# Patient Record
Sex: Male | Born: 1935 | Race: Black or African American | Hispanic: No | Marital: Married | Smoking: Never smoker
Health system: Southern US, Community
[De-identification: ages and names within clinical notes are randomized; demographics above are authoritative.]

## PROBLEM LIST (undated history)

## (undated) ENCOUNTER — Emergency Department (HOSPITAL_COMMUNITY): Admission: EM | Payer: Medicare Other | Source: Home / Self Care

## (undated) DIAGNOSIS — C801 Malignant (primary) neoplasm, unspecified: Secondary | ICD-10-CM

## (undated) DIAGNOSIS — E119 Type 2 diabetes mellitus without complications: Secondary | ICD-10-CM

## (undated) DIAGNOSIS — M109 Gout, unspecified: Secondary | ICD-10-CM

## (undated) DIAGNOSIS — N189 Chronic kidney disease, unspecified: Secondary | ICD-10-CM

## (undated) DIAGNOSIS — R413 Other amnesia: Secondary | ICD-10-CM

## (undated) DIAGNOSIS — I10 Essential (primary) hypertension: Secondary | ICD-10-CM

## (undated) HISTORY — PX: TONSILLECTOMY: SUR1361

## (undated) HISTORY — PX: KNEE SURGERY: SHX244

## (undated) HISTORY — PX: COLONOSCOPY W/ BIOPSIES AND POLYPECTOMY: SHX1376

## (undated) HISTORY — PX: BACK SURGERY: SHX140

---

## 2008-01-28 ENCOUNTER — Inpatient Hospital Stay (HOSPITAL_COMMUNITY): Admission: EM | Admit: 2008-01-28 | Discharge: 2008-02-02 | Payer: Self-pay | Admitting: Emergency Medicine

## 2008-03-06 ENCOUNTER — Encounter: Admission: RE | Admit: 2008-03-06 | Discharge: 2008-03-06 | Payer: Self-pay | Admitting: Neurosurgery

## 2008-10-26 IMAGING — CR DG THORACOLUMBAR SPINE 2V
2 series · 2 of 2 positions shown · non-contrast
Comparison: Preoperative portal chest x-ray 01/28/2008.

CLINICAL DATA: Status post PLIF.

THORACOLUMBAR SPINE - 2 VIEW

[t t-spine/l-spine junc. ap]
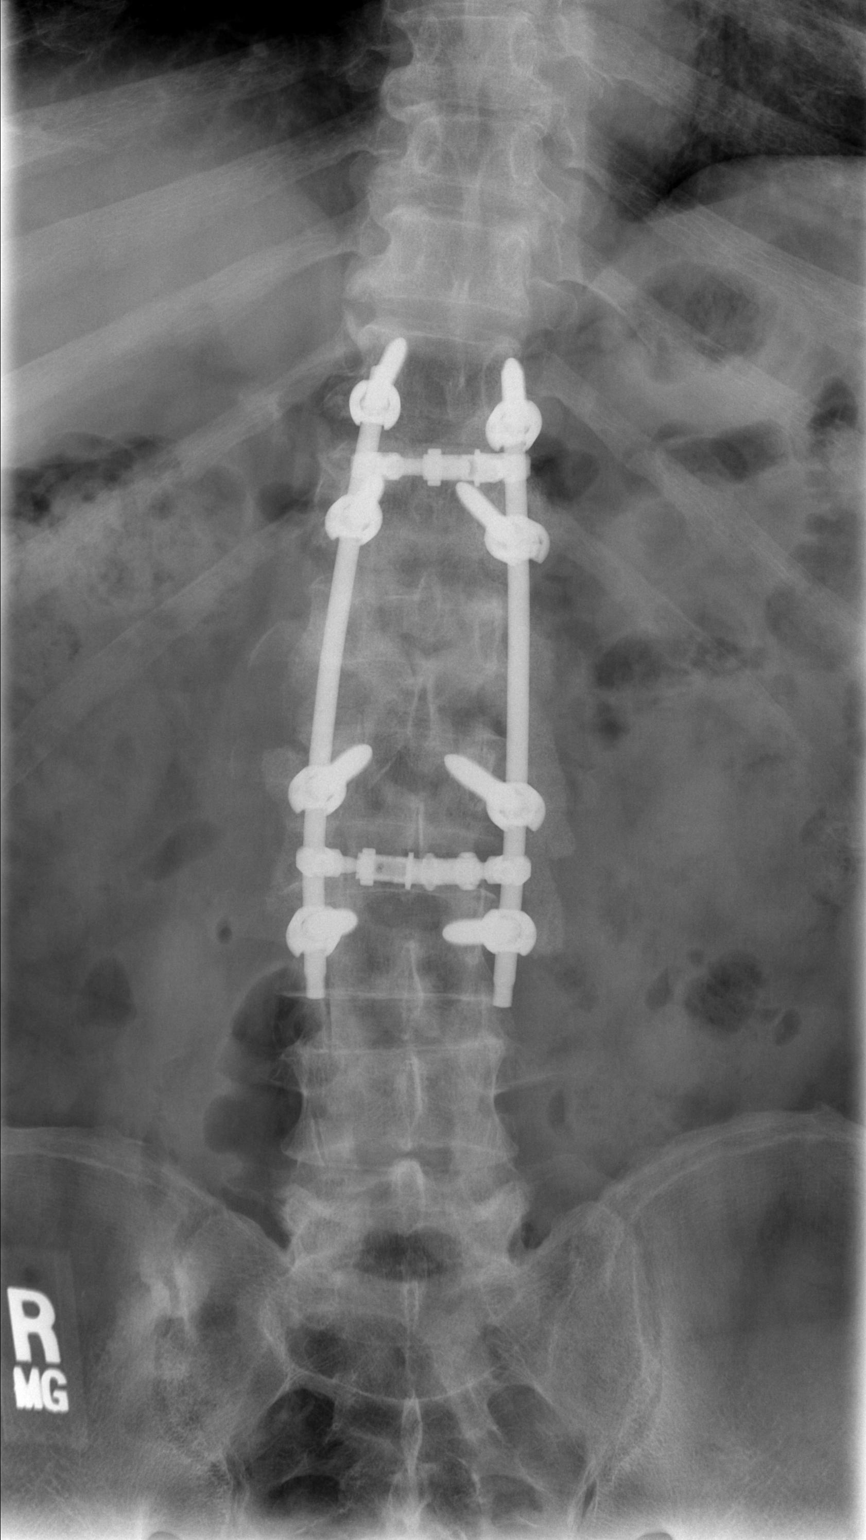

[t t-spine/l-spine junc lat]
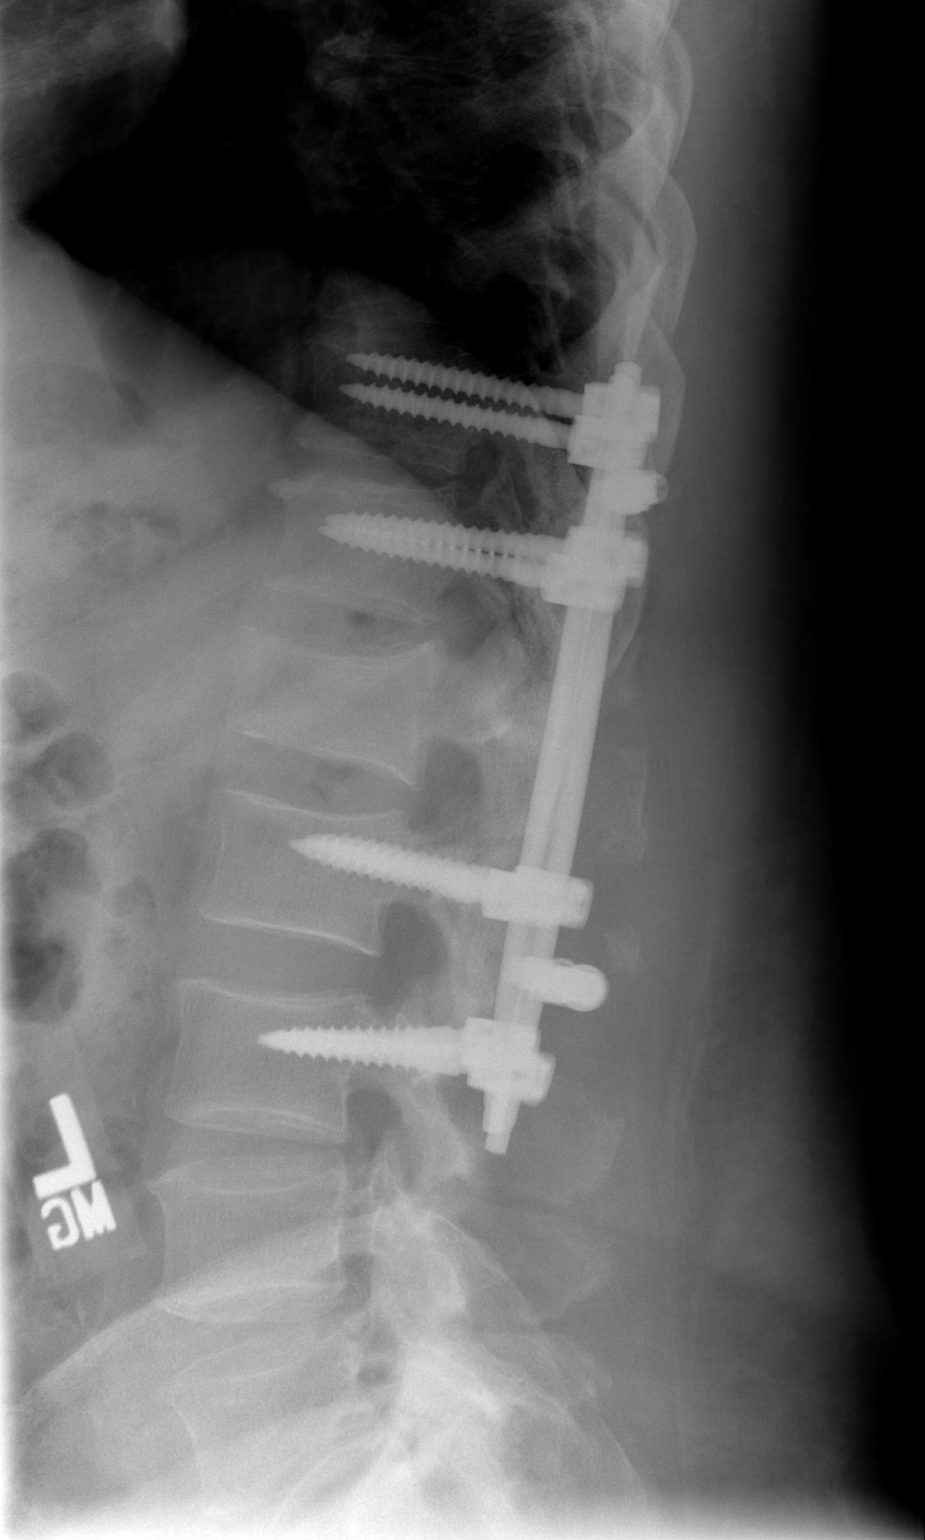

[2 of 2 positions shown; findings below may reference images not displayed]

FINDINGS: The patient is status post pedicle screw and rod fixation
from T11-L3 across the superior endplate compression fracture of
L1.  The L1 vertebral body is mildly sclerotic.  This is likely due
to osteoporotic compression fracture.  Alignment is anatomic.
There is no evidence for hardware complication.
IMPRESSION: Status post posterior fusion T11-L3 across an L1 superior endplate
compression fracture.

## 2010-05-27 ENCOUNTER — Emergency Department (HOSPITAL_COMMUNITY): Admission: EM | Admit: 2010-05-27 | Discharge: 2010-05-27 | Payer: Self-pay | Admitting: Emergency Medicine

## 2011-01-13 NOTE — Op Note (Signed)
Timothy Clay, Timothy Clay             ACCOUNT NO.:  [id number]   MEDICAL RECORD NO.:  [id number]          PATIENT TYPE:  INP   LOCATION:  3012                         FACILITY:  [facility]   PHYSICIAN:  Kathaleen Maser. Pool, M.D.    DATE OF BIRTH:  1935/10/13   DATE OF PROCEDURE:  01/29/2008  DATE OF DISCHARGE:                               OPERATIVE REPORT   DIAGNOSIS:  L1 burst fracture without spinal cord injury.   POSTOPERATIVE DIAGNOSIS:  L1 burst fracture without spinal cord injury.   PROCEDURES:  1. T11 through L3 posterolateral arthrodesis utilizing segmental      pedicle fixation and iliac crest autografting and bone graft      substitute.  2. Right iliac crest bone harvest.   SURGEON:  Kathaleen Maser. Pool, MD   ANESTHESIA:  General endotracheal.   INDICATION:  [name] is a 75 year old male who fell from a ladder  with a resultant L1 burst fracture.  The patient has no evidence of  neurological injury.  Workup demonstrates evidence of three-column  injury of his L1 vertebra without evidence of significant spinal  stenosis.  The patient has been counseled as to his options.  He has  decided to proceed with posterolateral arthrodesis in hopes of  stabilizing in spine and improving his situation.  The risks and  benefits have been discussed.  The patient including not limited the  risks of anesthesia, bleeding, infection, CSF leak, nerve root injury,  spinal cord injury, fusion failure, station failure, kidney pain, and  nonbenefit.  The patient was allowed to ask questions.  Understanding,  he wishes to proceed with surgery.  We will move forward today.   OPERATIVE:  The patient was placed on operative table in supine  position.  Anesthesia was achieved.  The patient was positioned prone  onto Wilson frame and appropriately padded.  The patient's thoracic and  lumbar regions were prepped draped sterilely.  A 10 blade was used to  make a linear incision extending from approximately  T10 down to L4.  This carried down sharply in the midline.  The subperiosteal dissection  then performed exposing the lamina, facet joints, and transverse process  of T11, T12, L1, L2, and L3.  Deep self-retaining retractors were  placed.  Intraoperative x-rays taken.  Level was confirmed.  All other  dissection was taking place.  A separate fascial plane was developed  over the lumbodorsal fascia on the right side down to level of the right  iliac crest.  The right iliac crest was dissected free.  A tricortical  piece of ileum was resected.  The iliac wing was then curetted with  curved bone gouges.  Autograft was collected for later use and fusion.  After an adequate volume of bone was achieved, the iliac crest was waxed  and the fascia was reapproximated with 0 Vicryl sutures.  He was then  placed back in the midline.  Pedicle substation was then placed at T11,  T12, L2, and L3 bilaterally under fluoroscopic guidance.  Entry sites  were made using the high-speed drill.  Using the surface landmarks and  intraoperative fluoroscopy, superficial bone around the pedicle was  first removed.  Using the high-speed drill, each pedicle was then probed  using the pedicle awl.  Pedicle awl track was probed and found to be  solid bone bridge.  The pedicle awl track was then tapped with a screw  tap.  Each screw tap hole was probed and found to be solid bone.  At T11  and T12, 5.75 x 45-mm radius screws were placed bilaterally.  At L2 and  L3, 6.75 x 14-mm screws were placed bilaterally.  Transverse processes  of L1, L2, and L3, as well as the transverse processes, facet joints,  and lamina of T11 and T12 were then decorticated using the high-speed  drill.  Morselized autograft mixed with Master graft bone graft  substitute was packed posterolaterally for later fusion.  A segment of  Titanium rod was then contoured and placed over the screw heads from T11-  L3.  Locking caps were then placed over the  screw heads.  The locking  caps were then engaged with construct under gentle compression.  Transverse connectors were placed in two locations.  Final images were  taken revealing good position of the bone graft hardware proper level  with a normalized spine.  We then irrigated one final time, then closed  in layers using Vicryl sutures.  Steri-Strips were applied.  No  complications.  The patient was well and he returned to recovery room  postoperatively.           ______________________________  Kathaleen Maser Pool, M.D.     HAP/MEDQ  D:  01/29/2008  T:  01/30/2008  Job:  [id number]

## 2011-01-16 NOTE — Discharge Summary (Signed)
NAMECHAISE, MAHABIR             ACCOUNT NO.:  [id number]   MEDICAL RECORD NO.:  [id number]          PATIENT TYPE:  INP   LOCATION:  3012                         FACILITY:  [facility]   PHYSICIAN:  Kathaleen Maser. Pool, M.D.    DATE OF BIRTH:  May 10, 1936   DATE OF ADMISSION:  01/28/2008  DATE OF DISCHARGE:  02/02/2008                               DISCHARGE SUMMARY   FINAL DIAGNOSIS:  L1 burst fracture secondary to fall.   OPERATIONS AND TREATMENTS:  T11 through L3 posterolateral arthrodesis  utilizing segmental pedicle-screw instrumentation and autografting.   HISTORY OF PRESENT ILLNESS:  Mr. Timothy Clay is a 75 year old male who  fell from a ladder, with evidence of an L1 burst fracture without spinal-  cord or cauda-equina injury.  The patient is taken to the operating room  for operative stabilization.   HOSPITAL COURSE:  The patient went to the operating room where an  uncomplicated T11-L3 fusion was performed.  Postoperatively, the patient  has done well.  Back pain is much improved postoperatively.  Wound is  healing well.  The patient was fitted for a thoracolumbar orthosis.  He  was gradually mobilized with the assistance of physical and occupational  therapy.  He is ready for home discharge.  He is neurologically intact.   CONDITION ON DISCHARGE:  Improved.   DISCHARGE DISPOSITION:  The patient will follow up in my office in 1  week.           ______________________________  Kathaleen Maser. Pool, M.D.     HAP/MEDQ  D:  03/27/2008  T:  03/27/2008  Job:  [id number]

## 2011-05-27 LAB — DIFFERENTIAL
Basophils Absolute: 0
Basophils Relative: 0
Eosinophils Relative: 0
Lymphocytes Relative: 10 — ABNORMAL LOW
Monocytes Absolute: 0.5
Monocytes Relative: 6

## 2011-05-27 LAB — COMPREHENSIVE METABOLIC PANEL
AST: 31
Albumin: 3.8
Alkaline Phosphatase: 69
Chloride: 110
GFR calc Af Amer: >60
Potassium: 4.3
Total Bilirubin: 1.7 — ABNORMAL HIGH
Total Protein: 6.6

## 2011-05-27 LAB — CROSSMATCH

## 2011-05-27 LAB — CBC
Platelets: 147 — ABNORMAL LOW
RDW: 13.9
WBC: 8.8

## 2011-05-27 LAB — ABO/RH: ABO/RH(D): O POS

## 2011-05-28 LAB — CBC
HCT: 29 — ABNORMAL LOW
Hemoglobin: 9.7 — ABNORMAL LOW
MCV: 83.7
Platelets: 91 — ABNORMAL LOW
WBC: 6.4

## 2020-12-26 ENCOUNTER — Ambulatory Visit: Payer: Self-pay

## 2020-12-26 ENCOUNTER — Ambulatory Visit (INDEPENDENT_AMBULATORY_CARE_PROVIDER_SITE_OTHER): Payer: Medicare Other | Admitting: Surgery

## 2020-12-26 ENCOUNTER — Ambulatory Visit: Payer: Self-pay | Admitting: Surgery

## 2020-12-26 ENCOUNTER — Encounter: Payer: Self-pay | Admitting: Surgery

## 2020-12-26 ENCOUNTER — Other Ambulatory Visit: Payer: Self-pay

## 2020-12-26 VITALS — BP 162/83 | HR 49

## 2020-12-26 DIAGNOSIS — M79641 Pain in right hand: Secondary | ICD-10-CM | POA: Diagnosis not present

## 2020-12-26 DIAGNOSIS — M542 Cervicalgia: Secondary | ICD-10-CM

## 2020-12-26 DIAGNOSIS — G5691 Unspecified mononeuropathy of right upper limb: Secondary | ICD-10-CM

## 2020-12-26 DIAGNOSIS — M79642 Pain in left hand: Secondary | ICD-10-CM

## 2020-12-26 DIAGNOSIS — G5692 Unspecified mononeuropathy of left upper limb: Secondary | ICD-10-CM | POA: Diagnosis not present

## 2020-12-26 NOTE — Progress Notes (Signed)
Office Visit Note   Patient: Timothy Clay           Date of Birth: 07-24-1936           MRN: [id number] Visit Date: 12/26/2020              Requested by: No referring provider defined for this encounter. PCP: Andres Shad, MD   Assessment & Plan: Visit Diagnoses:  1. Bilateral hand pain   2. Neck pain   3. Neuropathy of hand, left   4. Neuropathy of right hand     Plan: With patient's ongoing and worsening symptoms recommend getting a NCV/EMG study bilateral upper extremities to rule out cervical radiculopathy and bilateral carpal tunnel syndrome.  Patient will have study done next week with Dr. Ernestina Patches.  I will have patient follow-up with me in 3 weeks for recheck to discuss his results.  I will decide at that time if he needs cervical MRI depending on findings on nerve conduction study.  Advised patient and family members that some of his symptoms may be coming from issues at C5-6 and may also possibly have double crush phenomenon.  It if it looks like he is needing treatment with carpal tunnel releases I will discuss this with Dr. Louanne Skye.  All questions answered.  He can continue wearing his wrist splints at night.  Follow-Up Instructions: Return in about 3 weeks (around 01/16/2021) for with [facility] to review ncv/emg.   Orders:  Orders Placed This Encounter  Procedures  . XR Hand Complete Left  . XR Hand Complete Right  . XR Cervical Spine 2 or 3 views  . Ambulatory referral to Physical Medicine Rehab   No orders of the defined types were placed in this encounter.     Procedures: No procedures performed   Clinical Data: No additional findings.   Subjective: Chief Complaint  Patient presents with  . Right Hand - Pain  . Left Hand - Pain    HPI 85 year old black male who is new patient to clinic comes in with complaints of worsening bilateral hand pain numbness and tingling and some weakness.  Patient has a grandfather of April who works in our office.  He  states that his hand symptoms have been ongoing and worsening for about a year and a half.  He states that he saw his primary care physician about a year ago and was told that he probably had carpal tunnel syndrome.  He has not had any studies.  He denies any neck pain.  States that hands bother him with all activity.  He has been trying conservative management with wrist plants without significant improvement. Review of Systems No current cardiac pulmonary GI GU issues  Objective: Vital Signs: BP (!) 162/83   Pulse (!) 49   Physical Exam Constitutional:      Comments: Very pleasant elderly black male alert and oriented in no acute distress.  Patient comes in with family members today.  Eyes:     Extraocular Movements: Extraocular movements intact.  Pulmonary:     Effort: No respiratory distress.  Musculoskeletal:     Cervical back: Tenderness (Bilateral brachial plexus tenderness.) present.     Comments: Bilateral shoulder and elbow exam unremarkable.  Negative Tinel's over the cubital tunnels.  Bilateral wrist positive Phalen's and Tinel's.  He has bilateral thenar atrophy.  Also has moderate to marked tenderness at the bilateral first CMC joints.  Neurological:     Mental Status: He is alert and  oriented to person, place, and time.  Psychiatric:        Mood and Affect: Mood normal.     Ortho Exam  Specialty Comments:  No specialty comments available.  Imaging: No results found.   PMFS History: There are no problems to display for this patient.  History reviewed. No pertinent past medical history.  History reviewed. No pertinent family history.  History reviewed. No pertinent surgical history. Social History   Occupational History  . Not on file  Tobacco Use  . Smoking status: Never Smoker  . Smokeless tobacco: Not on file  Substance and Sexual Activity  . Alcohol use: Not on file  . Drug use: Not on file  . Sexual activity: Not on file

## 2021-01-02 ENCOUNTER — Other Ambulatory Visit: Payer: Self-pay

## 2021-01-02 ENCOUNTER — Ambulatory Visit (INDEPENDENT_AMBULATORY_CARE_PROVIDER_SITE_OTHER): Payer: Medicare Other | Admitting: Physical Medicine and Rehabilitation

## 2021-01-02 ENCOUNTER — Encounter: Payer: Self-pay | Admitting: Physical Medicine and Rehabilitation

## 2021-01-02 DIAGNOSIS — R202 Paresthesia of skin: Secondary | ICD-10-CM

## 2021-01-02 NOTE — Progress Notes (Signed)
Pt state he has pain and numbness in both hands and fingers. Pt state he has numbness in both thumb, pointer and middlefinger Pt state The numbness and pain keeps him up at night. 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 Pt state he use pain cream to help ease his pain. Pt state hes right handed.  Numeric Pain Rating Scale and Functional Assessment Average Pain 10   In the last MONTH (on 0-10 scale) has pain interfered with the following?  1. General activity like being  able to carry out your everyday physical activities such as walking, climbing stairs, carrying groceries, or moving a chair?  Rating(10)

## 2021-01-03 DIAGNOSIS — M109 Gout, unspecified: Secondary | ICD-10-CM | POA: Insufficient documentation

## 2021-01-03 DIAGNOSIS — I10 Essential (primary) hypertension: Secondary | ICD-10-CM | POA: Insufficient documentation

## 2021-01-03 DIAGNOSIS — E119 Type 2 diabetes mellitus without complications: Secondary | ICD-10-CM | POA: Insufficient documentation

## 2021-01-03 NOTE — Progress Notes (Signed)
Timothy Clay - 85 y.o. male MRN [id number]  Date of birth: 08-Feb-1936  Office Visit Note: Visit Date: 01/02/2021 PCP: Andres Shad, MD Referred by: Andres Shad, *  Subjective: Chief Complaint  Patient presents with  . Left Hand - Pain, Numbness  . Right Hand - Pain, Numbness   HPI:  Timothy Clay is a 85 y.o. male who comes in today at the request of Timothy Core, PA-C for electrodiagnostic study of the Bilateral upper extremities.  Patient is Right hand dominant.  He reports chronic long-term 10 out of 10 pain numbness and tingling in both hands left more than right.  He reports symptoms mainly in the radial 3 digits of the thumb index and middle finger bilaterally.  He does get nocturnal complaints.  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  He does use some pain creams and medications but without much relief.  He has noted difficulty with manipulating small objects and weakness in the hands.  He does not report any frank radicular symptoms.  He has had no prior electrodiagnostic studies.  ROS Otherwise per HPI.  Assessment & Plan: Visit Diagnoses:    ICD-10-CM   1. Paresthesia of skin  R20.2 NCV with EMG (electromyography)    Plan: Impression: The above electrodiagnostic study is ABNORMAL and reveals evidence of a severe BLATERAL left worse than right median nerve entrapment at the wrist (carpal tunnel syndrome) affecting sensory and motor components. The lesion is characterized by sensory and motor demyelination with evidence of significant axonal injury.  Despite appropriate decompression treatment there is likely going to be residual symptoms.   There is no significant electrodiagnostic evidence of any other focal nerve entrapment, brachial plexopathy or cervical radiculopathy.   Recommendations: 1.  Follow-up with referring physician. 2.  Continue current management of symptoms. 3.  Suggest surgical evaluation.   Meds & Orders: No orders of the defined  types were placed in this encounter.   Orders Placed This Encounter  Procedures  . NCV with EMG (electromyography)    Follow-up: Return for Timothy Core, PA-C as scheduled.   Procedures: No procedures performed  EMG & NCV Findings: Evaluation of the left median motor nerve showed no response (Wrist) and no response (Elbow).  The right median motor nerve showed prolonged distal onset latency (12.4 ms), reduced amplitude (1.5 mV), and decreased conduction velocity (Elbow-Wrist, 40 m/s).  The left median (across palm) sensory and the right median (across palm) sensory nerves showed no response (Wrist) and no response (Palm).  The right ulnar sensory nerve showed reduced amplitude (9.7 V).  All remaining nerves (as indicated in the following tables) were within normal limits.    Needle evaluation of the left abductor pollicis brevis muscle showed decreased insertional activity, widespread spontaneous activity, and diminished recruitment.  All remaining muscles (as indicated in the following table) showed no evidence of electrical instability.    Impression: The above electrodiagnostic study is ABNORMAL and reveals evidence of a severe BLATERAL left worse than right median nerve entrapment at the wrist (carpal tunnel syndrome) affecting sensory and motor components. The lesion is characterized by sensory and motor demyelination with evidence of significant axonal injury.  Despite appropriate decompression treatment there is likely going to be residual symptoms.   There is no significant electrodiagnostic evidence of any other focal nerve entrapment, brachial plexopathy or cervical radiculopathy.   Recommendations: 1.  Follow-up with referring physician. 2.  Continue current management of symptoms. 3.  Suggest surgical evaluation.  ___________________________  Laurence Spates FAAPMR Board Certified, American Board of Physical Medicine and Rehabilitation    Nerve Conduction Studies Anti Sensory  Summary Table   Stim Site NR Peak (ms) Norm Peak (ms) P-T Amp (V) Norm P-T Amp Site1 Site2 Delta-P (ms) Dist (cm) Vel (m/s) Norm Vel (m/s)  Left Median Acr Palm Anti Sensory (2nd Digit)  30.1C  Wrist *NR  <3.6  >10 Wrist Palm  0.0    Palm *NR  <2.0          Right Median Acr Palm Anti Sensory (2nd Digit)  30.2C  Wrist *NR  <3.6  >10 Wrist Palm  0.0    Palm *NR  <2.0          Right Radial Anti Sensory (Base 1st Digit)  30.8C  Wrist    2.5 <3.1 17.2  Wrist Base 1st Digit 2.5 0.0    Right Ulnar Anti Sensory (5th Digit)  31C  Wrist    3.6 <3.7 *9.7 >15.0 Wrist 5th Digit 3.6 14.0 39 >38   Motor Summary Table   Stim Site NR Onset (ms) Norm Onset (ms) O-P Amp (mV) Norm O-P Amp Site1 Site2 Delta-0 (ms) Dist (cm) Vel (m/s) Norm Vel (m/s)  Left Median Motor (Abd Poll Brev)  30.7C  Wrist *NR  <4.2  >5 Elbow Wrist  27.0  >50  Elbow *NR            Right Median Motor (Abd Poll Brev)  31C  Wrist    *12.4 <4.2 *1.5 >5 Elbow Wrist 6.7 26.5 *40 >50  Elbow    19.1  1.4         Right Ulnar Motor (Abd Dig Min)  31C  Wrist    3.3 <4.2 6.8 >3 B Elbow Wrist 4.7 25.0 53 >53  B Elbow    8.0  4.6  A Elbow B Elbow 2.0 11.0 55 >53  A Elbow    10.0  6.6          EMG   Side Muscle Nerve Root Ins Act Fibs Psw Amp Dur Poly Recrt Int Fraser Din Comment  Left Abd Poll Brev Median C8-T1 *Decr *4+ *4+ Nml Nml 0 *Reduced Nml rare MUAP  Left 1stDorInt Ulnar C8-T1 Nml Nml Nml Nml Nml 0 Nml Nml   Left PronatorTeres Median C6-7 Nml Nml Nml Nml Nml 0 Nml Nml     Nerve Conduction Studies Anti Sensory Left/Right Comparison   Stim Site L Lat (ms) R Lat (ms) L-R Lat (ms) L Amp (V) R Amp (V) L-R Amp (%) Site1 Site2 L Vel (m/s) R Vel (m/s) L-R Vel (m/s)  Median Acr Palm Anti Sensory (2nd Digit)  30.1C  Wrist       Wrist Palm     Palm             Radial Anti Sensory (Base 1st Digit)  30.8C  Wrist  2.5   17.2  Wrist Base 1st Digit     Ulnar Anti Sensory (5th Digit)  31C  Wrist  3.6   *9.7  Wrist 5th Digit  39     Motor Left/Right Comparison   Stim Site L Lat (ms) R Lat (ms) L-R Lat (ms) L Amp (mV) R Amp (mV) L-R Amp (%) Site1 Site2 L Vel (m/s) R Vel (m/s) L-R Vel (m/s)  Median Motor (Abd Poll Brev)  30.7C  Wrist  *12.4   *1.5  Elbow Wrist  40   Elbow  19.1   1.4  Ulnar Motor (Abd Dig Min)  31C  Wrist  3.3   6.8  B Elbow Wrist  53   B Elbow  8.0   4.6  A Elbow B Elbow  55   A Elbow  10.0   6.6           Waveforms:                 Clinical History: No specialty comments available.     Objective:  VS:  HT:    WT:   BMI:     BP:   HR: bpm  TEMP: ( )  RESP:  Physical Exam Musculoskeletal:        General: No tenderness.     Comments: Inspection reveals significant atrophy of the left APB but also atrophy of the right APB but no atrophy of the bilateral  FDI or hand intrinsics. There is no swelling, color changes, allodynia or dystrophic changes. There is 5 out of 5 strength in the bilateral wrist extension, finger abduction and long finger flexion.  There is decreased sensation to light touch in the bilateral median nerve distribution. There is a negative Hoffmann's test bilaterally.  Skin:    General: Skin is warm and dry.     Findings: No erythema or rash.  Neurological:     General: No focal deficit present.     Mental Status: He is alert and oriented to person, place, and time.     Sensory: No sensory deficit.     Motor: No weakness or abnormal muscle tone.     Coordination: Coordination normal.     Gait: Gait normal.  Psychiatric:        Mood and Affect: Mood normal.        Behavior: Behavior normal.        Thought Content: Thought content normal.      Imaging: No results found.

## 2021-01-06 ENCOUNTER — Other Ambulatory Visit: Payer: Self-pay

## 2021-01-06 ENCOUNTER — Ambulatory Visit (INDEPENDENT_AMBULATORY_CARE_PROVIDER_SITE_OTHER): Payer: Medicare Other | Admitting: Specialist

## 2021-01-06 VITALS — BP 175/84 | HR 51 | Ht 74.0 in | Wt 208.0 lb

## 2021-01-06 DIAGNOSIS — G5603 Carpal tunnel syndrome, bilateral upper limbs: Secondary | ICD-10-CM

## 2021-01-06 MED ORDER — GABAPENTIN 300 MG PO CAPS: 300.0000 mg | ORAL_CAPSULE | Freq: Every day | ORAL | 1 refills | Status: DC

## 2021-01-06 MED ORDER — METHYLPREDNISOLONE 4 MG PO TABS: 4.0000 mg | ORAL_TABLET | Freq: Every day | ORAL | 0 refills | Status: AC

## 2021-01-06 NOTE — Progress Notes (Signed)
Office Visit Note   Patient: Timothy Clay           Date of Birth: 05/18/1936           MRN: [id number] Visit Date: 01/06/2021              Requested by: Andres Shad, MD [address] [place] [id number] PCP: Andres Shad, MD   Assessment & Plan: Visit Diagnoses:  1. Carpal tunnel syndrome, bilateral     Plan:May use the wrist splints as needed. Our office will contact you to schedule for a left open carpal tunnel release. Kandice Hams is the surgery scheduler and she will get approval from your insurance and call you. Risk of surgery includes risk infection 1:300, bleeding is not usual, 1 in 1,000,000 risk of blood loss that is significant. Risk to the nerve is 1:30,000.   Follow-Up Instructions: No follow-ups on file.   Orders:  No orders of the defined types were placed in this encounter.  No orders of the defined types were placed in this encounter.     Procedures: No procedures performed   Clinical Data: No additional findings.   Subjective: Chief Complaint  Patient presents with  . Right Hand - Pain  . Left Hand - Pain    85 years old right handed male with 1 year history of bilateral hand and wrist pain. The pain is severe with night pain and difficulty sleeping. He is dropping items and has numbness and tingling. No neck pain or weakness. No neck pain with ROM. Underwent EMG/NCV due to severe pain and this shows severe CTS bilateral hands. .    Review of Systems  Constitutional: Negative.   HENT: Negative.   Eyes: Negative.   Respiratory: Negative.   Cardiovascular: Negative.   Gastrointestinal: Negative.   Endocrine: Negative.   Genitourinary: Negative.   Musculoskeletal: Negative.   Skin: Negative.   Allergic/Immunologic: Negative.   Neurological: Negative.   Hematological: Negative.   Psychiatric/Behavioral: Negative.      Objective: Vital Signs: BP (!) 175/84 (BP Location: Left Arm, Patient Position:  Sitting)   Pulse (!) 51   Ht 6\' 2"  (1.88 m)   Wt 208 lb (94.3 kg)   BMI 26.71 kg/m   Physical Exam Constitutional:      Appearance: He is well-developed.  HENT:     Head: Normocephalic and atraumatic.  Eyes:     Pupils: Pupils are equal, round, and reactive to light.  Pulmonary:     Effort: Pulmonary effort is normal.     Breath sounds: Normal breath sounds.  Abdominal:     General: Bowel sounds are normal.     Palpations: Abdomen is soft.  Musculoskeletal:        General: Normal range of motion.     Cervical back: Normal range of motion and neck supple.  Skin:    General: Skin is warm and dry.  Neurological:     Mental Status: He is alert and oriented to person, place, and time.  Psychiatric:        Behavior: Behavior normal.        Thought Content: Thought content normal.        Judgment: Judgment normal.     Right Hand Exam   Tests  Phalen's sign: positive Tinel's sign (median nerve): positive  Comments:  Reverse phalens positive immediately   Left Hand Exam   Tests  Phalen's sign: positive Tinel's sign (median nerve): positive  Comments:  Reverse phalens positive immediately      Specialty Comments:  No specialty comments available.  Imaging: No results found.   PMFS History: Patient Active Problem List   Diagnosis Date Noted  . Gout 01/03/2021  . Type 2 diabetes mellitus ([place]) 01/03/2021  . Hypertension 01/03/2021   No past medical history on file.  No family history on file.  No past surgical history on file. Social History   Occupational History  . Not on file  Tobacco Use  . Smoking status: Never Smoker  . Smokeless tobacco: Not on file  Substance and Sexual Activity  . Alcohol use: Not on file  . Drug use: Not on file  . Sexual activity: Not on file

## 2021-01-06 NOTE — Procedures (Signed)
EMG & NCV Findings: Evaluation of the left median motor nerve showed no response (Wrist) and no response (Elbow).  The right median motor nerve showed prolonged distal onset latency (12.4 ms), reduced amplitude (1.5 mV), and decreased conduction velocity (Elbow-Wrist, 40 m/s).  The left median (across palm) sensory and the right median (across palm) sensory nerves showed no response (Wrist) and no response (Palm).  The right ulnar sensory nerve showed reduced amplitude (9.7 V).  All remaining nerves (as indicated in the following tables) were within normal limits.    Needle evaluation of the left abductor pollicis brevis muscle showed decreased insertional activity, widespread spontaneous activity, and diminished recruitment.  All remaining muscles (as indicated in the following table) showed no evidence of electrical instability.    Impression: The above electrodiagnostic study is ABNORMAL and reveals evidence of a severe BLATERAL left worse than right median nerve entrapment at the wrist (carpal tunnel syndrome) affecting sensory and motor components. The lesion is characterized by sensory and motor demyelination with evidence of significant axonal injury.  Despite appropriate decompression treatment there is likely going to be residual symptoms.   There is no significant electrodiagnostic evidence of any other focal nerve entrapment, brachial plexopathy or cervical radiculopathy.   Recommendations: 1.  Follow-up with referring physician. 2.  Continue current management of symptoms. 3.  Suggest surgical evaluation.  ___________________________ Laurence Spates FAAPMR Board Certified, American Board of Physical Medicine and Rehabilitation    Nerve Conduction Studies Anti Sensory Summary Table   Stim Site NR Peak (ms) Norm Peak (ms) P-T Amp (V) Norm P-T Amp Site1 Site2 Delta-P (ms) Dist (cm) Vel (m/s) Norm Vel (m/s)  Left Median Acr Palm Anti Sensory (2nd Digit)  30.1C  Wrist *NR  <3.6  >10  Wrist Palm  0.0    Palm *NR  <2.0          Right Median Acr Palm Anti Sensory (2nd Digit)  30.2C  Wrist *NR  <3.6  >10 Wrist Palm  0.0    Palm *NR  <2.0          Right Radial Anti Sensory (Base 1st Digit)  30.8C  Wrist    2.5 <3.1 17.2  Wrist Base 1st Digit 2.5 0.0    Right Ulnar Anti Sensory (5th Digit)  31C  Wrist    3.6 <3.7 *9.7 >15.0 Wrist 5th Digit 3.6 14.0 39 >38   Motor Summary Table   Stim Site NR Onset (ms) Norm Onset (ms) O-P Amp (mV) Norm O-P Amp Site1 Site2 Delta-0 (ms) Dist (cm) Vel (m/s) Norm Vel (m/s)  Left Median Motor (Abd Poll Brev)  30.7C  Wrist *NR  <4.2  >5 Elbow Wrist  27.0  >50  Elbow *NR            Right Median Motor (Abd Poll Brev)  31C  Wrist    *12.4 <4.2 *1.5 >5 Elbow Wrist 6.7 26.5 *40 >50  Elbow    19.1  1.4         Right Ulnar Motor (Abd Dig Min)  31C  Wrist    3.3 <4.2 6.8 >3 B Elbow Wrist 4.7 25.0 53 >53  B Elbow    8.0  4.6  A Elbow B Elbow 2.0 11.0 55 >53  A Elbow    10.0  6.6          EMG   Side Muscle Nerve Root Ins Act Fibs Psw Amp Dur Poly Recrt Int Fraser Din Comment  Left Abd [employer]  Median C8-T1 *Decr *4+ *4+ Nml Nml 0 *Reduced Nml rare MUAP  Left 1stDorInt Ulnar C8-T1 Nml Nml Nml Nml Nml 0 Nml Nml   Left PronatorTeres Median C6-7 Nml Nml Nml Nml Nml 0 Nml Nml     Nerve Conduction Studies Anti Sensory Left/Right Comparison   Stim Site L Lat (ms) R Lat (ms) L-R Lat (ms) L Amp (V) R Amp (V) L-R Amp (%) Site1 Site2 L Vel (m/s) R Vel (m/s) L-R Vel (m/s)  Median Acr Palm Anti Sensory (2nd Digit)  30.1C  Wrist       Wrist Palm     Palm             Radial Anti Sensory (Base 1st Digit)  30.8C  Wrist  2.5   17.2  Wrist Base 1st Digit     Ulnar Anti Sensory (5th Digit)  31C  Wrist  3.6   *9.7  Wrist 5th Digit  39    Motor Left/Right Comparison   Stim Site L Lat (ms) R Lat (ms) L-R Lat (ms) L Amp (mV) R Amp (mV) L-R Amp (%) Site1 Site2 L Vel (m/s) R Vel (m/s) L-R Vel (m/s)  Median Motor (Abd Poll Brev)  30.7C  Wrist  *12.4   *1.5   Elbow Wrist  40   Elbow  19.1   1.4        Ulnar Motor (Abd Dig Min)  31C  Wrist  3.3   6.8  B Elbow Wrist  53   B Elbow  8.0   4.6  A Elbow B Elbow  55   A Elbow  10.0   6.6           Waveforms:

## 2021-01-06 NOTE — Patient Instructions (Addendum)
Plan:May use the wrist splints as needed. Our office will contact you to schedule for a left open carpal tunnel release. Kandice Hams is the surgery scheduler and she will get approval from your insurance and call you. Risk of surgery includes risk infection 1:300, bleeding is not usual, 1 in 1,000,000 risk of blood loss that is significant. Risk to the nerve is 1:30,000.Carpal Tunnel Syndrome  Carpal tunnel syndrome is a condition that causes pain in your hand and arm. The carpal tunnel is a narrow area located on the palm side of your wrist. Repeated wrist motion or certain diseases may cause swelling within the tunnel. This swelling pinches the main nerve in the wrist (median nerve). What are the causes? This condition may be caused by:  Repeated wrist motions.  Wrist injuries.  Arthritis.  A cyst or tumor in the carpal tunnel.  Fluid buildup during pregnancy. Sometimes the cause of this condition is not known. What increases the risk? This condition is more likely to develop in:  People who have jobs that cause them to repeatedly move their wrists in the same motion, such as Art gallery manager.  Women.  People with certain conditions, such as: ? Diabetes. ? Obesity. ? An underactive thyroid (hypothyroidism). ? Kidney failure. What are the signs or symptoms? Symptoms of this condition include:  A tingling feeling in your fingers, especially in your thumb, index, and middle fingers.  Tingling or numbness in your hand.  An aching feeling in your entire arm, especially when your wrist and elbow are bent for long periods of time.  Wrist pain that goes up your arm to your shoulder.  Pain that goes down into your palm or fingers.  A weak feeling in your hands. You may have trouble grabbing and holding items. Your symptoms may feel worse during the night. How is this diagnosed? This condition is diagnosed with a medical history and physical exam. You may also have  tests, including:  An electromyogram (EMG). This test measures electrical signals sent by your nerves into the muscles.  X-rays. How is this treated? Treatment for this condition includes:  Lifestyle changes. It is important to stop doing or modify the activity that caused your condition.  Physical or occupational therapy.  Medicines for pain and inflammation. This may include medicine that is injected into your wrist.  A wrist splint.  Surgery. Follow these instructions at home: If you have a splint:   Wear it as told by your health care provider. Remove it only as told by your health care provider.  Loosen the splint if your fingers become numb and tingle, or if they turn cold and blue.  Keep the splint clean and dry. General instructions   Take over-the-counter and prescription medicines only as told by your health care provider.  Rest your wrist from any activity that may be causing your pain. If your condition is work related, talk to your employer about changes that can be made, such as getting a wrist pad to use while typing.  If directed, apply ice to the painful area: ? Put ice in a plastic bag. ? Place a towel between your skin and the bag. ? Leave the ice on for 20 minutes, 2-3 times per day.  Keep all follow-up visits as told by your health care provider. This is important.  Do any exercises as told by your health care provider, physical therapist, or occupational therapist. Contact a health care provider if:  You have new symptoms.  Your pain is not controlled with medicines.  Your symptoms get worse. This information is not intended to replace advice given to you by your health care provider. Make sure you discuss any questions you have with your health care provider. Document Released: 08/14/2000 Document Revised: 12/26/2015 Document Reviewed: 04/28/2017 Elsevier Interactive Patient Education  2017 [employer]. Vitamin B complex

## 2021-01-07 ENCOUNTER — Encounter (HOSPITAL_BASED_OUTPATIENT_CLINIC_OR_DEPARTMENT_OTHER): Payer: Self-pay | Admitting: Specialist

## 2021-01-07 ENCOUNTER — Other Ambulatory Visit: Payer: Self-pay

## 2021-01-09 ENCOUNTER — Other Ambulatory Visit: Payer: Self-pay

## 2021-01-09 ENCOUNTER — Other Ambulatory Visit (HOSPITAL_COMMUNITY): Payer: Medicare Other

## 2021-01-09 ENCOUNTER — Encounter (HOSPITAL_BASED_OUTPATIENT_CLINIC_OR_DEPARTMENT_OTHER)
Admission: RE | Admit: 2021-01-09 | Discharge: 2021-01-09 | Disposition: A | Discharge status: Home / Self Care | Payer: Medicare Other | Source: Ambulatory Visit | Attending: Specialist | Admitting: Specialist

## 2021-01-09 DIAGNOSIS — Z01818 Encounter for other preprocedural examination: Secondary | ICD-10-CM | POA: Diagnosis not present

## 2021-01-09 DIAGNOSIS — Z20822 Contact with and (suspected) exposure to covid-19: Secondary | ICD-10-CM | POA: Diagnosis not present

## 2021-01-09 LAB — BASIC METABOLIC PANEL
Anion gap: 5 (ref 5–15)
BUN: 10 mg/dL (ref 8–23)
CO2: 31 mmol/L (ref 22–32)
Calcium: 8.6 mg/dL — ABNORMAL LOW (ref 8.9–10.3)
Chloride: 105 mmol/L (ref 98–111)
Creatinine, Ser: 1.14 mg/dL (ref 0.61–1.24)
GFR, Estimated: >60 mL/min (ref >60)
Glucose, Bld: 99 mg/dL (ref 70–99)
Potassium: 3.4 mmol/L — ABNORMAL LOW (ref 3.5–5.1)
Sodium: 141 mmol/L (ref 135–145)

## 2021-01-09 LAB — SURGICAL PCR SCREEN
MRSA, PCR: NEGATIVE
Staphylococcus aureus: NEGATIVE

## 2021-01-09 LAB — CBC
HCT: 36.1 % — ABNORMAL LOW (ref 39.0–52.0)
Hemoglobin: 11.9 g/dL — ABNORMAL LOW (ref 13.0–17.0)
MCH: 28.1 pg (ref 26.0–34.0)
MCHC: 33 g/dL (ref 30.0–36.0)
MCV: 85.1 fL (ref 80.0–100.0)
Platelets: 156 10*3/uL (ref 150–400)
RBC: 4.24 MIL/uL (ref 4.22–5.81)
RDW: 14.6 % (ref 11.5–15.5)
WBC: 4.6 10*3/uL (ref 4.0–10.5)
nRBC: 0 % (ref 0.0–0.2)

## 2021-01-09 LAB — SARS CORONAVIRUS 2 (TAT 6-24 HRS): SARS Coronavirus 2: NEGATIVE

## 2021-01-09 NOTE — Progress Notes (Signed)
EKG reviewed by Dr. Valma Cava, ok to proceed with surgery at [facility].

## 2021-01-09 NOTE — Progress Notes (Signed)

## 2021-01-13 ENCOUNTER — Encounter (HOSPITAL_BASED_OUTPATIENT_CLINIC_OR_DEPARTMENT_OTHER)
Admission: RE | Disposition: A | Discharge status: Home / Self Care | Payer: Self-pay | Source: Ambulatory Visit | Attending: Specialist

## 2021-01-13 ENCOUNTER — Ambulatory Visit (HOSPITAL_BASED_OUTPATIENT_CLINIC_OR_DEPARTMENT_OTHER): Payer: Medicare Other | Admitting: Anesthesiology

## 2021-01-13 ENCOUNTER — Encounter (HOSPITAL_BASED_OUTPATIENT_CLINIC_OR_DEPARTMENT_OTHER): Payer: Self-pay | Admitting: Specialist

## 2021-01-13 ENCOUNTER — Other Ambulatory Visit: Payer: Self-pay

## 2021-01-13 ENCOUNTER — Ambulatory Visit (HOSPITAL_BASED_OUTPATIENT_CLINIC_OR_DEPARTMENT_OTHER)
Admission: RE | Admit: 2021-01-13 | Discharge: 2021-01-13 | Disposition: A | Discharge status: Home / Self Care | Payer: Medicare Other | Source: Ambulatory Visit | Attending: Specialist | Admitting: Specialist

## 2021-01-13 DIAGNOSIS — Z79899 Other long term (current) drug therapy: Secondary | ICD-10-CM | POA: Diagnosis not present

## 2021-01-13 DIAGNOSIS — G5603 Carpal tunnel syndrome, bilateral upper limbs: Secondary | ICD-10-CM | POA: Diagnosis not present

## 2021-01-13 DIAGNOSIS — G5602 Carpal tunnel syndrome, left upper limb: Secondary | ICD-10-CM

## 2021-01-13 DIAGNOSIS — Z7952 Long term (current) use of systemic steroids: Secondary | ICD-10-CM | POA: Insufficient documentation

## 2021-01-13 HISTORY — DX: Essential (primary) hypertension: I10

## 2021-01-13 HISTORY — DX: Chronic kidney disease, unspecified: N18.9

## 2021-01-13 HISTORY — DX: Gout, unspecified: M10.9

## 2021-01-13 HISTORY — DX: Type 2 diabetes mellitus without complications: E11.9

## 2021-01-13 HISTORY — PX: CARPAL TUNNEL RELEASE: SHX101

## 2021-01-13 HISTORY — DX: Other amnesia: R41.3

## 2021-01-13 HISTORY — DX: Malignant (primary) neoplasm, unspecified: C80.1

## 2021-01-13 LAB — GLUCOSE, CAPILLARY
Glucose-Capillary: 103 mg/dL — ABNORMAL HIGH (ref 70–99)
Glucose-Capillary: 96 mg/dL (ref 70–99)

## 2021-01-13 SURGERY — CARPAL TUNNEL RELEASE
Anesthesia: Regional | Site: Wrist | Laterality: Left

## 2021-01-13 MED ORDER — 0.9 % SODIUM CHLORIDE (POUR BTL) OPTIME
TOPICAL | Status: DC | PRN
  Administered 2021-01-13: 100 mL

## 2021-01-13 MED ORDER — PROMETHAZINE HCL 25 MG/ML IJ SOLN: 6.2500 mg | INTRAMUSCULAR | Status: DC | PRN

## 2021-01-13 MED ORDER — HYDROMORPHONE HCL 1 MG/ML IJ SOLN: 0.2500 mg | INTRAMUSCULAR | Status: DC | PRN

## 2021-01-13 MED ORDER — BUPIVACAINE HCL (PF) 0.25 % IJ SOLN
INTRAMUSCULAR | Status: AC
  Filled 2021-01-13: qty 30

## 2021-01-13 MED ORDER — ONDANSETRON HCL 4 MG/2ML IJ SOLN
INTRAMUSCULAR | Status: DC | PRN
  Administered 2021-01-13: 4 mg via INTRAVENOUS

## 2021-01-13 MED ORDER — LIDOCAINE HCL (PF) 0.5 % IJ SOLN
INTRAMUSCULAR | Status: DC | PRN
  Administered 2021-01-13: 30 mL via INTRAVENOUS

## 2021-01-13 MED ORDER — BUPIVACAINE HCL (PF) 0.25 % IJ SOLN
INTRAMUSCULAR | Status: DC | PRN
  Administered 2021-01-13: 15 mL

## 2021-01-13 MED ORDER — OXYCODONE HCL 5 MG PO TABS: 5.0000 mg | ORAL_TABLET | ORAL | 0 refills | Status: DC | PRN

## 2021-01-13 MED ORDER — OXYCODONE HCL 5 MG/5ML PO SOLN: 5.0000 mg | Freq: Once | ORAL | Status: DC | PRN

## 2021-01-13 MED ORDER — BUPIVACAINE HCL (PF) 0.5 % IJ SOLN
INTRAMUSCULAR | Status: AC
  Filled 2021-01-13: qty 30

## 2021-01-13 MED ORDER — LACTATED RINGERS IV SOLN: INTRAVENOUS | Status: DC

## 2021-01-13 MED ORDER — CEFAZOLIN SODIUM-DEXTROSE 2-4 GM/100ML-% IV SOLN
2.0000 g | INTRAVENOUS | Status: AC
  Administered 2021-01-13: 2 g via INTRAVENOUS

## 2021-01-13 MED ORDER — PROPOFOL 500 MG/50ML IV EMUL
INTRAVENOUS | Status: DC | PRN
  Administered 2021-01-13: 75 ug/kg/min via INTRAVENOUS

## 2021-01-13 MED ORDER — FENTANYL CITRATE (PF) 100 MCG/2ML IJ SOLN
INTRAMUSCULAR | Status: AC
  Filled 2021-01-13: qty 2

## 2021-01-13 MED ORDER — CEFAZOLIN SODIUM-DEXTROSE 2-4 GM/100ML-% IV SOLN
INTRAVENOUS | Status: AC
  Filled 2021-01-13: qty 100

## 2021-01-13 MED ORDER — MIDAZOLAM HCL 2 MG/2ML IJ SOLN
INTRAMUSCULAR | Status: AC
  Filled 2021-01-13: qty 2

## 2021-01-13 MED ORDER — ONDANSETRON HCL 4 MG/2ML IJ SOLN
INTRAMUSCULAR | Status: AC
  Filled 2021-01-13: qty 2

## 2021-01-13 MED ORDER — LIDOCAINE HCL (PF) 1 % IJ SOLN
INTRAMUSCULAR | Status: AC
  Filled 2021-01-13: qty 30

## 2021-01-13 MED ORDER — HYDRALAZINE HCL 20 MG/ML IJ SOLN
INTRAMUSCULAR | Status: DC | PRN
  Administered 2021-01-13: 5 mg via INTRAVENOUS

## 2021-01-13 MED ORDER — OXYCODONE HCL 5 MG PO TABS: 5.0000 mg | ORAL_TABLET | Freq: Once | ORAL | Status: DC | PRN

## 2021-01-13 MED ORDER — FENTANYL CITRATE (PF) 100 MCG/2ML IJ SOLN
INTRAMUSCULAR | Status: DC | PRN
  Administered 2021-01-13: 50 ug via INTRAVENOUS

## 2021-01-13 SURGICAL SUPPLY — 46 items
BAND RUBBER #18 3X1/16 STRL (MISCELLANEOUS) ×2 IMPLANT
BENZOIN TINCTURE PRP APPL 2/3 (GAUZE/BANDAGES/DRESSINGS) IMPLANT
BLADE SURG 15 STRL LF DISP TIS (BLADE) ×1 IMPLANT
BLADE SURG 15 STRL SS (BLADE) ×1
BNDG ELASTIC 3X5.8 VLCR STR LF (GAUZE/BANDAGES/DRESSINGS) ×2 IMPLANT
BNDG ESMARK 4X9 LF (GAUZE/BANDAGES/DRESSINGS) ×2 IMPLANT
CORD BIPOLAR FORCEPS 12FT (ELECTRODE) ×2 IMPLANT
COVER BACK TABLE 60X90IN (DRAPES) ×2 IMPLANT
COVER MAYO STAND STRL (DRAPES) ×2 IMPLANT
COVER WAND RF STERILE (DRAPES) IMPLANT
CUFF TOURN SGL QUICK 18X4 (TOURNIQUET CUFF) ×2 IMPLANT
DERMABOND ADVANCED (GAUZE/BANDAGES/DRESSINGS) ×1
DERMABOND ADVANCED .7 DNX12 (GAUZE/BANDAGES/DRESSINGS) ×1 IMPLANT
DRAPE EXTREMITY T 121X128X90 (DISPOSABLE) ×2 IMPLANT
DRAPE SURG 17X23 STRL (DRAPES) ×2 IMPLANT
DRSG EMULSION OIL 3X3 NADH (GAUZE/BANDAGES/DRESSINGS) ×2 IMPLANT
DRSG PAD ABDOMINAL 8X10 ST (GAUZE/BANDAGES/DRESSINGS) IMPLANT
DURAPREP 26ML APPLICATOR (WOUND CARE) ×2 IMPLANT
ELECT REM PT RETURN 9FT ADLT (ELECTROSURGICAL)
ELECTRODE REM PT RTRN 9FT ADLT (ELECTROSURGICAL) IMPLANT
GAUZE SPONGE 4X4 12PLY STRL (GAUZE/BANDAGES/DRESSINGS) ×2 IMPLANT
GAUZE SPONGE 4X4 12PLY STRL LF (GAUZE/BANDAGES/DRESSINGS) IMPLANT
GLOVE SRG 8 PF TXTR STRL LF DI (GLOVE) ×1 IMPLANT
GLOVE SURG ORTHO LTX SZ7.5 (GLOVE) ×2 IMPLANT
GLOVE SURG UNDER POLY LF SZ8 (GLOVE) ×1
GLOVE SURG UNDER POLY LF SZ9 (GLOVE) ×2 IMPLANT
GOWN STRL REUS W/ TWL LRG LVL3 (GOWN DISPOSABLE) ×1 IMPLANT
GOWN STRL REUS W/TWL 2XL LVL3 (GOWN DISPOSABLE) ×2 IMPLANT
GOWN STRL REUS W/TWL LRG LVL3 (GOWN DISPOSABLE) ×1
NEEDLE HYPO 22GX1.5 SAFETY (NEEDLE) ×2 IMPLANT
PACK BASIN DAY SURGERY FS (CUSTOM PROCEDURE TRAY) ×2 IMPLANT
PAD CAST 3X4 CTTN HI CHSV (CAST SUPPLIES) ×1 IMPLANT
PADDING CAST ABS 4INX4YD NS (CAST SUPPLIES) ×1
PADDING CAST ABS COTTON 4X4 ST (CAST SUPPLIES) ×1 IMPLANT
PADDING CAST COTTON 3X4 STRL (CAST SUPPLIES) ×1
PENCIL SMOKE EVACUATOR (MISCELLANEOUS) IMPLANT
SPLINT FIBERGLASS 3X35 (CAST SUPPLIES) IMPLANT
SPLINT PLASTER CAST XFAST 3X15 (CAST SUPPLIES) ×10 IMPLANT
SPLINT PLASTER XTRA FASTSET 3X (CAST SUPPLIES) ×10
STOCKINETTE 4X48 STRL (DRAPES) ×2 IMPLANT
STRIP CLOSURE SKIN 1/2X4 (GAUZE/BANDAGES/DRESSINGS) IMPLANT
SUT ETHILON 4 0 PS 2 18 (SUTURE) ×2 IMPLANT
SUT VIC AB 3-0 FS2 27 (SUTURE) IMPLANT
SYR BULB EAR ULCER 3OZ GRN STR (SYRINGE) IMPLANT
SYR CONTROL 10ML LL (SYRINGE) IMPLANT
TOWEL GREEN STERILE FF (TOWEL DISPOSABLE) ×2 IMPLANT

## 2021-01-13 NOTE — Op Note (Addendum)
01/13/2021  11:30 AM  PATIENT:  Timothy Clay  85 y.o. male  MRN: [id number]   OPERATIVE REPORT  PRE-OPERATIVE DIAGNOSIS:  Left carpal tunnel syndrome  POST-OPERATIVE DIAGNOSIS:  Left carpal tunnel syndrome  PROCEDURE:  Procedure(s): LEFT OPEN CARPAL TUNNEL RELEASE   SURGEON:  Jessy Oto, MD     ASSISTANT: None     ANESTHESIA:  Regional Bier Block  Left mid forearm Level,Supplemented with local Marcaine 3.29 % 51OA    COMPLICATIONS:  None.   DRAINS: rubber band drain placed into the incision from proximal to distal and tied with 4-0 nylon inorder to reach the proximal dressing.     TOURNIQUET TIME: 20 minutes at  385mHg   PROCEDURE: The patient was met in the holding area, and the appropriate wrist identified and marked left wrist with my initials and an "x".The patient was then transported to OR and was placed on the operative table in a supine position. The patient was then placed under Bier block anesthesia without difficulty. The patient received appropriate preoperative antibiotic prophylaxis.     The left upper extremity was then prepped using sterile conditions and draped using sterile technique.  Time-out procedure was called and correct  .Using loope magnification and head lamp a 1.5 inch incision curved at the wrist crease with 15 blade scalpel.  Incision through skin and subcutaneous tissue to the volar forearm fascia and  transverse carpal ligament. Fascia then carefully lifed and incised with Stevens scissors inline with the fourth digit. The skin and subcutaneous tissue retracted and the volar fascia divided under direct vision from distal to proximal. The incision was carried proximally an additional 1.5cm to identify the median nerve. A freer elevator then carefully placed between the median nerve and the transverse carpal ligament protecting the  median nerve as the transverse carpal ligament was divided with a 15 blade scalpel in line with the fourth digit.  Retracting the distal skin and subcutaneous tissues distally under direct visualization the remaining portions of the transverse carpal ligament were divided with tenotomy scissors again in line with the fourth digit. The palmar fascia was then divided until the traversing superficial palmar arch was identified and preserved intact.  The motor branch of the median nerve was carefully examined and identified intact. Tourniquet was then released. Bleeding controlled with bipolar electrocautery. The incision was then irrigated with copious amounts of irrigant solution, No active bleeding was present. The incision closed with a single layer skin closure of 4-0 nylon horizontal mattress sutures. A sterile rubber band then threaded intothe incision from proximal suture line with forceps. The rubber band was tied proximally with a 4-0 nylon suture.  Dry dressing of adaptic, 4x4s  And ABD held in place with sterile webril.  A well padded volar splint applied with ace wrap.  The patient reactivated and returned to the PACU in good condition.  All instruments and sponge counts were correct.          JBasil Dess 01/13/2021, 11:30 AM

## 2021-01-13 NOTE — Discharge Instructions (Addendum)
    Keep dressing dry. Elevated wrist above heart. Apply ice to palm side of wrist two hours on and one half hour off for 48 hours. May Apply ice at night and go to sleep with out changing. Be sure to keep ice off fingers to prevent frost bite.  Return to office in three days  Thursday AM 8:30AMfor removal of drain left wrist.  Post Anesthesia Home Care Instructions  Activity: Get plenty of rest for the remainder of the day. A responsible individual must stay with you for 24 hours following the procedure.  For the next 24 hours, DO NOT: -Drive a car -Paediatric nurse -Drink alcoholic beverages -Take any medication unless instructed by your physician -Make any legal decisions or sign important papers.  Meals: Start with liquid foods such as gelatin or soup. Progress to regular foods as tolerated. Avoid greasy, spicy, heavy foods. If nausea and/or vomiting occur, drink only clear liquids until the nausea and/or vomiting subsides. Call your physician if vomiting continues.  Special Instructions/Symptoms: Your throat may feel dry or sore from the anesthesia or the breathing tube placed in your throat during surgery. If this causes discomfort, gargle with warm salt water. The discomfort should disappear within 24 hours.  If you had a scopolamine patch placed behind your ear for the management of post- operative nausea and/or vomiting:  1. The medication in the patch is effective for 72 hours, after which it should be removed.  Wrap patch in a tissue and discard in the trash. Wash hands thoroughly with soap and water. 2. You may remove the patch earlier than 72 hours if you experience unpleasant side effects which may include dry mouth, dizziness or visual disturbances. 3. Avoid touching the patch. Wash your hands with soap and water after contact with the patch.    Regional Anesthesia Blocks  1. Numbness or the inability to move the "blocked" extremity may last from 3-48 hours after  placement. The length of time depends on the medication injected and your individual response to the medication. If the numbness is not going away after 48 hours, call your surgeon.  2. The extremity that is blocked will need to be protected until the numbness is gone and the  Strength has returned. Because you cannot feel it, you will need to take extra care to avoid injury. Because it may be weak, you may have difficulty moving it or using it. You may not know what position it is in without looking at it while the block is in effect.  3. For blocks in the legs and feet, returning to weight bearing and walking needs to be done carefully. You will need to wait until the numbness is entirely gone and the strength has returned. You should be able to move your leg and foot normally before you try and bear weight or walk. You will need someone to be with you when you first try to ensure you do not fall and possibly risk injury.  4. Bruising and tenderness at the needle site are common side effects and will resolve in a few days.  5. Persistent numbness or new problems with movement should be communicated to the surgeon or the [place] [phone] [place] [phone]).

## 2021-01-13 NOTE — Anesthesia Preprocedure Evaluation (Signed)
Anesthesia Evaluation  Patient identified by MRN, date of birth, ID band Patient awake    Reviewed: Allergy & Precautions, NPO status , Patient's Chart, lab work & pertinent test results  Airway Mallampati: II  TM Distance: >3 FB Neck ROM: Full    Dental no notable dental hx.    Pulmonary neg pulmonary ROS,    Pulmonary exam normal breath sounds clear to auscultation       Cardiovascular hypertension, Pt. on medications negative cardio ROS Normal cardiovascular exam Rhythm:Regular Rate:Normal     Neuro/Psych negative neurological ROS  negative psych ROS   GI/Hepatic negative GI ROS, Neg liver ROS,   Endo/Other  negative endocrine ROSdiabetes, Type 2  Renal/GU negative Renal ROS  negative genitourinary   Musculoskeletal negative musculoskeletal ROS (+)   Abdominal   Peds negative pediatric ROS (+)  Hematology negative hematology ROS (+)   Anesthesia Other Findings   Reproductive/Obstetrics negative OB ROS                             Anesthesia Physical  Anesthesia Plan  ASA: III  Anesthesia Plan: Bier Block and Bier Block-LIDOCAINE ONLY   Post-op Pain Management:    Induction: Intravenous  PONV Risk Score and Plan: 1 and Ondansetron and Treatment may vary due to age or medical condition  Airway Management Planned: Simple Face Mask  Additional Equipment:   Intra-op Plan:   Post-operative Plan:   Informed Consent: I have reviewed the patients History and Physical, chart, labs and discussed the procedure including the risks, benefits and alternatives for the proposed anesthesia with the patient or authorized representative who has indicated his/her understanding and acceptance.     Dental advisory given  Plan Discussed with: CRNA  Anesthesia Plan Comments:         Anesthesia Quick Evaluation  

## 2021-01-13 NOTE — H&P (Signed)
PREOPERATIVE H&P  Chief Complaint: Left carpal tunnel syndrome  HPI: Timothy Clay is a 85 y.o. male who presents for preoperative history and physical with a diagnosis of Left carpal tunnel syndrome. Symptoms are rated as moderate to severe, and have been worsening.  This is significantly impairing activities of daily living.  He has elected for surgical management.  Interpretation Summary  EMG & NCV Findings: Evaluation of the left median motor nerve showed no response (Wrist) and no response (Elbow).  The right median motor nerve showed prolonged distal onset latency (12.4 ms), reduced amplitude (1.5 mV), and decreased conduction velocity (Elbow-Wrist, 40 m/s).  The left median (across palm) sensory and the right median (across palm) sensory nerves showed no response (Wrist) and no response (Palm).  The right ulnar sensory nerve showed reduced amplitude (9.7 V).  All remaining nerves (as indicated in the following tables) were within normal limits.    Needle evaluation of the left abductor pollicis brevis muscle showed decreased insertional activity, widespread spontaneous activity, and diminished recruitment.  All remaining muscles (as indicated in the following table) showed no evidence of electrical instability.    Impression: The above electrodiagnostic study is ABNORMAL and reveals evidence of a severe BLATERAL left worse than right median nerve entrapment at the wrist (carpal tunnel syndrome) affecting sensory and motor components. The lesion is characterized by sensory and motor demyelination with evidence of significant axonal injury.  Despite appropriate decompression treatment there is likely going to be residual symptoms.   There is no significant electrodiagnostic evidence of any other focal nerve entrapment, brachial plexopathy or cervical radiculopathy.   Recommendations: 1.  Follow-up with referring physician. 2.  Continue current management of symptoms. 3.  Suggest  surgical evaluation.  ___________________________ Wonda Olds Board Certified, American Board of Physical Medicine and Rehabilitation     Nerve Conduction Studies   Past Medical History:  Diagnosis Date  . Cancer [facility])    prostate cancer, not treating just watching numbers  . Chronic kidney disease    CKD  . Diabetes mellitus without complication (HCC)    no meds now  . Gout   . Hypertension   . Memory deficit    Past Surgical History:  Procedure Laterality Date  . BACK SURGERY    . COLONOSCOPY W/ BIOPSIES AND POLYPECTOMY    . KNEE SURGERY    . TONSILLECTOMY     Social History   Socioeconomic History  . Marital status: Married    Spouse name: Not on file  . Number of children: Not on file  . Years of education: Not on file  . Highest education level: Not on file  Occupational History  . Not on file  Tobacco Use  . Smoking status: Never Smoker  . Smokeless tobacco: Never Used  Substance and Sexual Activity  . Alcohol use: Never  . Drug use: Never  . Sexual activity: Not on file  Other Topics Concern  . Not on file  Social History Narrative  . Not on file   Social Determinants of Health   Financial Resource Strain: Not on file  Food Insecurity: Not on file  Transportation Needs: Not on file  Physical Activity: Not on file  Stress: Not on file  Social Connections: Not on file   History reviewed. No pertinent family history. No Known Allergies Prior to Admission medications   Medication Sig Start Date End Date Taking? Authorizing Provider  allopurinol (ZYLOPRIM) 300 MG tablet Take by mouth. 07/20/17  Yes [provider]  Cod Liver Oil CAPS Take by mouth.   Yes [provider]  gabapentin (NEURONTIN) 300 MG capsule Take 1 capsule (300 mg total) by mouth at bedtime. 01/06/21  Yes Jessy Oto, MD  Ginkgo Biloba 40 MG TABS Take by mouth.   Yes [provider]  losartan (COZAAR) 100 MG tablet Take by mouth. 10/06/19  Yes  [provider]  methylPREDNISolone (MEDROL) 4 MG tablet Take 1 tablet (4 mg total) by mouth daily. 01/06/21  Yes Jessy Oto, MD  nebivolol (BYSTOLIC) 5 MG tablet Take by mouth. 10/06/19  Yes [provider]  pravastatin (PRAVACHOL) 20 MG tablet Take by mouth. 07/20/17  Yes [provider]     Positive ROS: All other systems have been reviewed and were otherwise negative with the exception of those mentioned in the HPI and as above.  Physical Exam: General: Alert, no acute distress Cardiovascular: No pedal edema Respiratory: No cyanosis, no use of accessory musculature GI: No organomegaly, abdomen is soft and non-tender Skin: No lesions in the area of chief complaint Neurologic: Sensation intact distally Psychiatric: Patient is competent for consent with normal mood and affect Lymphatic: No axillary or cervical lymphadenopathy  MUSCULOSKELETAL:Left hand with exquisitely tender volar wrist to pressure. Positive tinel's left volar wrist, numbness left radial 3 1/2 digits. Positive phalen's sign at 3 seconds. Reverse phalen's sign is positive. Mild thenar wasting.   Assessment: Left carpal tunnel syndrome greater than right carpal tunnel syndrome.   Plan: Plan for Procedure(s): LEFT OPEN CARPAL TUNNEL RELEASE  The risks benefits and alternatives were discussed with the patient including but not limited to the risks of nonoperative treatment, versus surgical intervention including infection, bleeding, nerve injury,  blood clots, cardiopulmonary complications, morbidity, mortality, among others, and they were willing to proceed.   Basil Dess, MD Cell [phone] Office [phone] 01/13/2021 10:14 AM

## 2021-01-13 NOTE — Interval H&P Note (Signed)
History and Physical Interval Note:  01/13/2021 10:17 AM  Timothy Clay  has presented today for surgery, with the diagnosis of Left carpal tunnel syndrome.  The various methods of treatment have been discussed with the patient and family. After consideration of risks, benefits and other options for treatment, the patient has consented to  Procedure(s): LEFT OPEN CARPAL TUNNEL RELEASE (Left) as a surgical intervention.  The patient's history has been reviewed, patient examined, no change in status, stable for surgery.  I have reviewed the patient's chart and labs.  Questions were answered to the patient's satisfaction.     Basil Dess

## 2021-01-13 NOTE — Brief Op Note (Signed)
01/13/2021  11:28 AM  PATIENT:  Timothy Clay  85 y.o. male  PRE-OPERATIVE DIAGNOSIS:  Left carpal tunnel syndrome  POST-OPERATIVE DIAGNOSIS:  Left carpal tunnel syndrome  PROCEDURE:  Procedure(s): LEFT OPEN CARPAL TUNNEL RELEASE (Left)  SURGEON:  Surgeon(s) and Role:    Jessy Oto, MD - Primary  ANESTHESIA:   local, regional and IV sedation  EBL:  5 mL   BLOOD ADMINISTERED:none  DRAINS: rubber band drain left volar wrist incision   LOCAL MEDICATIONS USED:  MARCAINE 0.25% Amount: 15 ml  SPECIMEN:  No Specimen  DISPOSITION OF SPECIMEN:  N/A  COUNTS:  YES  TOURNIQUET:   Total Tourniquet Time Documented: Forearm (Left) - 20 minutes Total: Forearm (Left) - 20 minutes   DICTATION: .Dragon Dictation  PLAN OF CARE: Discharge to home after PACU  PATIENT DISPOSITION:  PACU - hemodynamically stable.   Delay start of Pharmacological VTE agent (>24hrs) due to surgical blood loss or risk of bleeding: yes

## 2021-01-13 NOTE — Anesthesia Postprocedure Evaluation (Signed)
Anesthesia Post Note  Patient: Quartez Lagos  Procedure(s) Performed: LEFT OPEN CARPAL TUNNEL RELEASE (Left Wrist)     Patient location during evaluation: PACU Anesthesia Type: Bier Block Level of consciousness: awake and alert Pain management: pain level controlled Vital Signs Assessment: post-procedure vital signs reviewed and stable Respiratory status: spontaneous breathing, nonlabored ventilation and respiratory function stable Cardiovascular status: blood pressure returned to baseline and stable Postop Assessment: no apparent nausea or vomiting Anesthetic complications: no   No complications documented.  Last Vitals:  Vitals:   01/13/21 1200 01/13/21 1212  BP: (!) 171/84 (!) 162/91  Pulse: (!) 52 (!) 52  Resp: 12 12  Temp:  36.4 C  SpO2: 99% 99%    Last Pain:  Vitals:   01/13/21 1212  TempSrc:   PainSc: 0-No pain                 Lynda Rainwater

## 2021-01-13 NOTE — Transfer of Care (Signed)
Immediate Anesthesia Transfer of Care Note  Patient: Timothy Clay  Procedure(s) Performed: LEFT OPEN CARPAL TUNNEL RELEASE (Left Wrist)  Patient Location: PACU  Anesthesia Type:Bier block  Level of Consciousness: drowsy, patient cooperative and responds to stimulation  Airway & Oxygen Therapy: Patient Spontanous Breathing and Patient connected to face mask oxygen  Post-op Assessment: Report given to RN and Post -op Vital signs reviewed and stable  Post vital signs: Reviewed and stable  Last Vitals:  Vitals Value Taken Time  BP    Temp    Pulse 48 01/13/21 1120  Resp 17 01/13/21 1120  SpO2 100 % 01/13/21 1120  Vitals shown include unvalidated device data.  Last Pain:  Vitals:   01/13/21 0901  TempSrc: Oral  PainSc: 6       Patients Stated Pain Goal: 5 (74/25/95 6387)  Complications: No complications documented.

## 2021-01-13 NOTE — Anesthesia Procedure Notes (Signed)
Anesthesia Regional Block: Bier block (IV Regional)   Pre-Anesthetic Checklist: ,, timeout performed, Correct Patient, Correct Site, Correct Laterality, Correct Procedure, Correct Position, site marked, Risks and benefits discussed,  Surgical consent,  Pre-op evaluation,  At surgeon's request and post-op pain management  Laterality: Left  Prep: alcohol swabs        Procedures:,, transarterial technique,,, intact distal pulses, Esmarch exsanguination, single tourniquet utilized, #20gu IV placed  Narrative:  CRNA: Verita Lamb, CRNA

## 2021-01-14 ENCOUNTER — Encounter (HOSPITAL_BASED_OUTPATIENT_CLINIC_OR_DEPARTMENT_OTHER): Payer: Self-pay | Admitting: Specialist

## 2021-01-16 ENCOUNTER — Ambulatory Visit (INDEPENDENT_AMBULATORY_CARE_PROVIDER_SITE_OTHER): Payer: Medicare Other | Admitting: Specialist

## 2021-01-16 ENCOUNTER — Other Ambulatory Visit: Payer: Self-pay

## 2021-01-16 ENCOUNTER — Encounter: Payer: Self-pay | Admitting: Specialist

## 2021-01-16 VITALS — BP 167/87 | HR 51 | Ht 74.0 in | Wt 229.0 lb

## 2021-01-16 DIAGNOSIS — M79642 Pain in left hand: Secondary | ICD-10-CM

## 2021-01-16 DIAGNOSIS — M79641 Pain in right hand: Secondary | ICD-10-CM

## 2021-01-16 DIAGNOSIS — M542 Cervicalgia: Secondary | ICD-10-CM

## 2021-01-16 DIAGNOSIS — G5691 Unspecified mononeuropathy of right upper limb: Secondary | ICD-10-CM

## 2021-01-16 DIAGNOSIS — G5603 Carpal tunnel syndrome, bilateral upper limbs: Secondary | ICD-10-CM

## 2021-01-16 NOTE — Patient Instructions (Signed)
    Keep dressing dry.  Apply ice to palm side of wrist two hours on and one half hour off for 48 hours. May Apply ice at night and go to sleep with out changing. Be sure to keep ice off fingers to prevent frost bite.  Return to office in 13 days for removal of sutures left wrist.

## 2021-01-16 NOTE — Progress Notes (Signed)
   Post-Op Visit Note   Patient: Timothy Clay           Date of Birth: [birth year]           MRN: [id number] Visit Date: 01/16/2021 PCP: Andres Shad, MD   Assessment & Plan:3 day post op left open carpal tunnel release.  Chief Complaint:  Chief Complaint  Patient presents with  . Left Hand - Routine Post Op    Here for Drain removal   Visit Diagnoses: No diagnosis found. Incision is healing without sign of redness or swelling Rubber band drain removed  Plan:Keep dressing dry.  Apply ice to palm side of wrist two hours on and one half hour off for 48 hours. May Apply ice at night and go to sleep with out changing. Be sure to keep ice off fingers to prevent frost bite.  Return to office in 13 days for removal of sutures left wrist.  Follow-Up Instructions: Return in about 13 days (around 01/29/2021) for Removal of sutures and wound eval..   Orders:  No orders of the defined types were placed in this encounter.  No orders of the defined types were placed in this encounter.   Imaging: No results found.  PMFS History: Patient Active Problem List   Diagnosis Date Noted  . Carpal tunnel syndrome, bilateral 01/13/2021    Priority: High    Class: Chronic  . Gout 01/03/2021  . Type 2 diabetes mellitus ([place]) 01/03/2021  . Hypertension 01/03/2021   Past Medical History:  Diagnosis Date  . Cancer [facility])    prostate cancer, not treating just watching numbers  . Chronic kidney disease    CKD  . Diabetes mellitus without complication ([facility])    no meds now  . Gout   . Hypertension   . Memory deficit     No family history on file.  Past Surgical History:  Procedure Laterality Date  . BACK SURGERY    . CARPAL TUNNEL RELEASE Left 01/13/2021   Procedure: LEFT OPEN CARPAL TUNNEL RELEASE;  Surgeon: Jessy Oto, MD;  Location: [place];  Service: Orthopedics;  Laterality: Left;  . COLONOSCOPY W/ BIOPSIES AND POLYPECTOMY    . KNEE SURGERY    .  TONSILLECTOMY     Social History   Occupational History  . Not on file  Tobacco Use  . Smoking status: Never Smoker  . Smokeless tobacco: Never Used  Substance and Sexual Activity  . Alcohol use: Never  . Drug use: Never  . Sexual activity: Not on file

## 2021-01-29 ENCOUNTER — Ambulatory Visit (INDEPENDENT_AMBULATORY_CARE_PROVIDER_SITE_OTHER): Payer: Medicare Other | Admitting: Surgery

## 2021-01-29 ENCOUNTER — Encounter: Payer: Self-pay | Admitting: Surgery

## 2021-01-29 ENCOUNTER — Other Ambulatory Visit: Payer: Self-pay

## 2021-01-29 VITALS — BP 166/88 | HR 50

## 2021-01-29 DIAGNOSIS — Z9889 Other specified postprocedural states: Secondary | ICD-10-CM

## 2021-02-04 NOTE — Progress Notes (Signed)
85 year old black male who is 2-week status post left carpal tunnel release returns.  States that he is doing well.  Preop symptoms greatly improved.  Continues have some residual numbness and tingling in his fingers but advised patient that this can be expected due to the severity of the problem that he had.  He is ready to proceed with scheduling of right carpal tunnel release in a couple weeks.  Exam Very pleasant male alert and oriented in no acute distress.  Today left hand sutures removed and Steri-Strips applied.  Incision healing well without signs of infection.  Plan Patient was given a removable wrist plan.  Work gentle range of motion of his wrist and hand.  No lifting pushing pulling with his left hand.  His son is present with him today.  Advised patient that he should hold off on driving.  He has a date for June 17 to have right carpal tunnel release.  He will come back and see me in 2 weeks just before that surgery for recheck.

## 2021-02-07 ENCOUNTER — Other Ambulatory Visit: Payer: Self-pay

## 2021-02-07 ENCOUNTER — Encounter (HOSPITAL_BASED_OUTPATIENT_CLINIC_OR_DEPARTMENT_OTHER): Payer: Self-pay | Admitting: Specialist

## 2021-02-12 ENCOUNTER — Other Ambulatory Visit: Payer: Self-pay

## 2021-02-12 ENCOUNTER — Encounter: Payer: Self-pay | Admitting: Surgery

## 2021-02-12 ENCOUNTER — Ambulatory Visit (INDEPENDENT_AMBULATORY_CARE_PROVIDER_SITE_OTHER): Payer: Medicare Other | Admitting: Surgery

## 2021-02-12 VITALS — BP 131/81 | HR 50

## 2021-02-12 DIAGNOSIS — G5601 Carpal tunnel syndrome, right upper limb: Secondary | ICD-10-CM

## 2021-02-12 NOTE — Progress Notes (Signed)
85 year old black male with history of right carpal tunnel syndrome comes in for preop evaluation.  States that right hand symptoms unchanged from previous visit.  He is wanting to proceed with right carpal tunnel release as scheduled this Friday.  Patient is about 4 weeks status post left carpal tunnel release.  States that he continues to have numbness and tingling in his thumb index and long finger but this is improved from preop.  No longer has left hand pain.  He is wondering when the numbness will go away.  NCV/EMG study that was done Jan 02, 2021 by Dr. Ernestina Patches showed:  Impression: The above electrodiagnostic study is ABNORMAL and reveals evidence of a severe BLATERAL left worse than right median nerve entrapment at the wrist (carpal tunnel syndrome) affecting sensory and motor components. The lesion is characterized by sensory and motor demyelination with evidence of significant axonal injury.  Despite appropriate decompression treatment there is likely going to be residual symptoms.  Plan We will proceed with right carpal tunnel release as scheduled.  I advised patient and daughter who was present that he may have residual numbness in both hands always even with successful decompression procedure.  Advised that we were looking at improvement of his pain/symptoms and prevention of further injury.  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  All questions answered.

## 2021-02-13 NOTE — H&P (Signed)
Timothy Clay is an 85 y.o. male.   Chief Complaint: right hand pain, numbness, tingling, weakness   HPI: 85 year old black male with history of right carpal tunnel syndrome comes in for preop evaluation.  States that right hand symptoms unchanged from previous visit.  He is wanting to proceed with right carpal tunnel release as scheduled this Friday.  Patient is about 4 weeks status post left carpal tunnel release.  States that he continues to have numbness and tingling in his thumb index and long finger but this is improved from preop.  No longer has left hand pain.  He is wondering when the numbness will go away.  NCV/EMG study that was done Jan 02, 2021 by Dr. Ernestina Patches showed:   Impression: The above electrodiagnostic study is ABNORMAL and reveals evidence of a severe BLATERAL left worse than right median nerve entrapment at the wrist (carpal tunnel syndrome) affecting sensory and motor components. The lesion is characterized by sensory and motor demyelination with evidence of significant axonal injury.  Despite appropriate decompression treatment there is likely going to be residual symptoms.     Past Medical History:  Diagnosis Date   Cancer [facility])    prostate cancer, not treating just watching numbers   Chronic kidney disease    CKD   Diabetes mellitus without complication ([place])    no meds now   Gout    Hypertension    Memory deficit     Past Surgical History:  Procedure Laterality Date   BACK SURGERY     CARPAL TUNNEL RELEASE Left 01/13/2021   Procedure: LEFT OPEN CARPAL TUNNEL RELEASE;  Surgeon: Jessy Oto, MD;  Location: [place];  Service: Orthopedics;  Laterality: Left;   COLONOSCOPY W/ BIOPSIES AND POLYPECTOMY     KNEE SURGERY     TONSILLECTOMY      History reviewed. No pertinent family history. Social History:  reports that he has never smoked. He has never used smokeless tobacco. He reports that he does not drink alcohol and does not use  drugs.  Allergies: No Known Allergies  No medications prior to admission.    No results found for this or any previous visit (from the past 48 hour(s)). No results found.  Review of Systems  Height 6\' 2"  (1.88 m), weight 103.9 kg. Physical Exam   Assessment/Plan Right carpal tunnel syndrome  Plan We will proceed with right carpal tunnel release as scheduled.  I advised patient and daughter who was present that he may have residual numbness in both hands always even with successful decompression procedure.  Advised that we were looking at improvement of his pain/symptoms and prevention of further injury.  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  All questions answered.  [name], PA-C 02/13/2021, 10:14 PM

## 2021-02-14 ENCOUNTER — Encounter (HOSPITAL_BASED_OUTPATIENT_CLINIC_OR_DEPARTMENT_OTHER)
Admission: RE | Disposition: A | Discharge status: Home / Self Care | Payer: Self-pay | Source: Home / Self Care | Attending: Specialist

## 2021-02-14 ENCOUNTER — Encounter (HOSPITAL_BASED_OUTPATIENT_CLINIC_OR_DEPARTMENT_OTHER): Payer: Self-pay | Admitting: Specialist

## 2021-02-14 ENCOUNTER — Ambulatory Visit (HOSPITAL_BASED_OUTPATIENT_CLINIC_OR_DEPARTMENT_OTHER): Payer: Medicare Other | Admitting: Anesthesiology

## 2021-02-14 ENCOUNTER — Ambulatory Visit (HOSPITAL_BASED_OUTPATIENT_CLINIC_OR_DEPARTMENT_OTHER)
Admission: RE | Admit: 2021-02-14 | Discharge: 2021-02-14 | Disposition: A | Discharge status: Home / Self Care | Payer: Medicare Other | Attending: Specialist | Admitting: Specialist

## 2021-02-14 ENCOUNTER — Other Ambulatory Visit: Payer: Self-pay

## 2021-02-14 DIAGNOSIS — G5601 Carpal tunnel syndrome, right upper limb: Secondary | ICD-10-CM | POA: Diagnosis present

## 2021-02-14 DIAGNOSIS — G5603 Carpal tunnel syndrome, bilateral upper limbs: Secondary | ICD-10-CM | POA: Diagnosis present

## 2021-02-14 DIAGNOSIS — Z8546 Personal history of malignant neoplasm of prostate: Secondary | ICD-10-CM | POA: Diagnosis not present

## 2021-02-14 HISTORY — PX: CARPAL TUNNEL RELEASE: SHX101

## 2021-02-14 LAB — GLUCOSE, CAPILLARY
Glucose-Capillary: 104 mg/dL — ABNORMAL HIGH (ref 70–99)
Glucose-Capillary: 86 mg/dL (ref 70–99)

## 2021-02-14 SURGERY — CARPAL TUNNEL RELEASE
Anesthesia: Regional | Site: Wrist | Laterality: Right

## 2021-02-14 MED ORDER — PROPOFOL 10 MG/ML IV BOLUS
INTRAVENOUS | Status: DC | PRN
  Administered 2021-02-14: 10 mg via INTRAVENOUS
  Administered 2021-02-14: 20 mg via INTRAVENOUS

## 2021-02-14 MED ORDER — HYDROCODONE-ACETAMINOPHEN 5-325 MG PO TABS: 1.0000 | ORAL_TABLET | Freq: Four times a day (QID) | ORAL | 0 refills | Status: DC | PRN

## 2021-02-14 MED ORDER — GLYCOPYRROLATE PF 0.2 MG/ML IJ SOSY
PREFILLED_SYRINGE | INTRAMUSCULAR | Status: AC
  Filled 2021-02-14: qty 1

## 2021-02-14 MED ORDER — OXYCODONE HCL 5 MG/5ML PO SOLN: 5.0000 mg | Freq: Once | ORAL | Status: DC | PRN

## 2021-02-14 MED ORDER — PROPOFOL 10 MG/ML IV BOLUS
INTRAVENOUS | Status: AC
  Filled 2021-02-14: qty 20

## 2021-02-14 MED ORDER — PROPOFOL 500 MG/50ML IV EMUL
INTRAVENOUS | Status: DC | PRN
  Administered 2021-02-14: 25 ug/kg/min via INTRAVENOUS

## 2021-02-14 MED ORDER — OXYCODONE HCL 5 MG PO TABS: 5.0000 mg | ORAL_TABLET | Freq: Once | ORAL | Status: DC | PRN

## 2021-02-14 MED ORDER — MEPERIDINE HCL 25 MG/ML IJ SOLN: 6.2500 mg | INTRAMUSCULAR | Status: DC | PRN

## 2021-02-14 MED ORDER — ONDANSETRON HCL 4 MG/2ML IJ SOLN
INTRAMUSCULAR | Status: AC
  Filled 2021-02-14: qty 2

## 2021-02-14 MED ORDER — LIDOCAINE HCL (PF) 0.5 % IJ SOLN
INTRAMUSCULAR | Status: DC | PRN
  Administered 2021-02-14: 30 mL via INTRAVENOUS

## 2021-02-14 MED ORDER — LIDOCAINE HCL (PF) 0.5 % IJ SOLN
INTRAMUSCULAR | Status: AC
  Filled 2021-02-14: qty 50

## 2021-02-14 MED ORDER — LACTATED RINGERS IV SOLN: INTRAVENOUS | Status: DC

## 2021-02-14 MED ORDER — BUPIVACAINE HCL (PF) 0.25 % IJ SOLN
INTRAMUSCULAR | Status: DC | PRN
  Administered 2021-02-14: 8 mL

## 2021-02-14 MED ORDER — FENTANYL CITRATE (PF) 100 MCG/2ML IJ SOLN: 25.0000 ug | INTRAMUSCULAR | Status: DC | PRN

## 2021-02-14 MED ORDER — ONDANSETRON HCL 4 MG/2ML IJ SOLN
INTRAMUSCULAR | Status: DC | PRN
  Administered 2021-02-14: 4 mg via INTRAVENOUS

## 2021-02-14 MED ORDER — ACETAMINOPHEN 160 MG/5ML PO SOLN: 325.0000 mg | ORAL | Status: DC | PRN

## 2021-02-14 MED ORDER — CEFAZOLIN SODIUM-DEXTROSE 2-4 GM/100ML-% IV SOLN
INTRAVENOUS | Status: AC
  Filled 2021-02-14: qty 100

## 2021-02-14 MED ORDER — ONDANSETRON HCL 4 MG/2ML IJ SOLN: 4.0000 mg | Freq: Once | INTRAMUSCULAR | Status: DC | PRN

## 2021-02-14 MED ORDER — FENTANYL CITRATE (PF) 100 MCG/2ML IJ SOLN
INTRAMUSCULAR | Status: DC | PRN
  Administered 2021-02-14 (×4): 25 ug via INTRAVENOUS

## 2021-02-14 MED ORDER — GLYCOPYRROLATE 0.2 MG/ML IJ SOLN
INTRAMUSCULAR | Status: DC | PRN
  Administered 2021-02-14: .1 mg via INTRAVENOUS

## 2021-02-14 MED ORDER — ACETAMINOPHEN 325 MG PO TABS
325.0000 mg | ORAL_TABLET | ORAL | Status: DC | PRN
Start: 2021-02-14 — End: 2021-02-14

## 2021-02-14 MED ORDER — 0.9 % SODIUM CHLORIDE (POUR BTL) OPTIME
TOPICAL | Status: DC | PRN
  Administered 2021-02-14: 200 mL

## 2021-02-14 MED ORDER — CEFAZOLIN SODIUM-DEXTROSE 2-4 GM/100ML-% IV SOLN
2.0000 g | INTRAVENOUS | Status: AC
  Administered 2021-02-14: 2 g via INTRAVENOUS

## 2021-02-14 MED ORDER — FENTANYL CITRATE (PF) 100 MCG/2ML IJ SOLN
INTRAMUSCULAR | Status: AC
  Filled 2021-02-14: qty 2

## 2021-02-14 SURGICAL SUPPLY — 51 items
ADH SKN CLS APL DERMABOND .7 (GAUZE/BANDAGES/DRESSINGS) ×1
APL SKNCLS STERI-STRIP NONHPOA (GAUZE/BANDAGES/DRESSINGS)
BAND INSRT 18 STRL LF DISP RB (MISCELLANEOUS)
BAND RUBBER #18 3X1/16 STRL (MISCELLANEOUS) IMPLANT
BENZOIN TINCTURE PRP APPL 2/3 (GAUZE/BANDAGES/DRESSINGS) IMPLANT
BLADE SURG 15 STRL LF DISP TIS (BLADE) ×1 IMPLANT
BLADE SURG 15 STRL SS (BLADE) ×2
BNDG CMPR 9X4 STRL LF SNTH (GAUZE/BANDAGES/DRESSINGS)
BNDG ELASTIC 3X5.8 VLCR STR LF (GAUZE/BANDAGES/DRESSINGS) ×2 IMPLANT
BNDG ESMARK 4X9 LF (GAUZE/BANDAGES/DRESSINGS) IMPLANT
CORD BIPOLAR FORCEPS 12FT (ELECTRODE) ×2 IMPLANT
COVER BACK TABLE 60X90IN (DRAPES) ×2 IMPLANT
COVER MAYO STAND STRL (DRAPES) ×2 IMPLANT
COVER WAND RF STERILE (DRAPES) IMPLANT
CUFF TOURN SGL QUICK 18X4 (TOURNIQUET CUFF) ×2 IMPLANT
DERMABOND ADVANCED (GAUZE/BANDAGES/DRESSINGS) ×1
DERMABOND ADVANCED .7 DNX12 (GAUZE/BANDAGES/DRESSINGS) ×1 IMPLANT
DRAPE EXTREMITY T 121X128X90 (DISPOSABLE) ×2 IMPLANT
DRAPE SURG 17X23 STRL (DRAPES) ×2 IMPLANT
DRSG EMULSION OIL 3X3 NADH (GAUZE/BANDAGES/DRESSINGS) ×2 IMPLANT
DRSG PAD ABDOMINAL 8X10 ST (GAUZE/BANDAGES/DRESSINGS) IMPLANT
DURAPREP 26ML APPLICATOR (WOUND CARE) ×2 IMPLANT
ELECT REM PT RETURN 9FT ADLT (ELECTROSURGICAL)
ELECTRODE REM PT RTRN 9FT ADLT (ELECTROSURGICAL) IMPLANT
GAUZE SPONGE 4X4 12PLY STRL (GAUZE/BANDAGES/DRESSINGS) ×2 IMPLANT
GAUZE SPONGE 4X4 12PLY STRL LF (GAUZE/BANDAGES/DRESSINGS) ×2 IMPLANT
GLOVE SRG 8 PF TXTR STRL LF DI (GLOVE) ×1 IMPLANT
GLOVE SURG ORTHO LTX SZ7.5 (GLOVE) ×2 IMPLANT
GLOVE SURG UNDER POLY LF SZ8 (GLOVE) ×2
GLOVE SURG UNDER POLY LF SZ9 (GLOVE) ×2 IMPLANT
GOWN STRL REUS W/ TWL LRG LVL3 (GOWN DISPOSABLE) ×1 IMPLANT
GOWN STRL REUS W/TWL 2XL LVL3 (GOWN DISPOSABLE) ×2 IMPLANT
GOWN STRL REUS W/TWL LRG LVL3 (GOWN DISPOSABLE) ×2
NEEDLE HYPO 22GX1.5 SAFETY (NEEDLE) ×2 IMPLANT
PACK BASIN DAY SURGERY FS (CUSTOM PROCEDURE TRAY) ×2 IMPLANT
PAD CAST 3X4 CTTN HI CHSV (CAST SUPPLIES) ×1 IMPLANT
PADDING CAST ABS 4INX4YD NS (CAST SUPPLIES) ×1
PADDING CAST ABS COTTON 4X4 ST (CAST SUPPLIES) ×1 IMPLANT
PADDING CAST COTTON 3X4 STRL (CAST SUPPLIES) ×2
PENCIL SMOKE EVACUATOR (MISCELLANEOUS) IMPLANT
SPLINT FIBERGLASS 3X35 (CAST SUPPLIES) IMPLANT
SPLINT FIBERGLASS 4X30 (CAST SUPPLIES) ×2 IMPLANT
SPLINT PLASTER CAST XFAST 3X15 (CAST SUPPLIES) IMPLANT
SPLINT PLASTER XTRA FASTSET 3X (CAST SUPPLIES)
STOCKINETTE 4X48 STRL (DRAPES) ×2 IMPLANT
STRIP CLOSURE SKIN 1/2X4 (GAUZE/BANDAGES/DRESSINGS) IMPLANT
SUT ETHILON 4 0 PS 2 18 (SUTURE) ×2 IMPLANT
SUT VIC AB 3-0 FS2 27 (SUTURE) IMPLANT
SYR BULB EAR ULCER 3OZ GRN STR (SYRINGE) ×2 IMPLANT
SYR CONTROL 10ML LL (SYRINGE) ×2 IMPLANT
TOWEL GREEN STERILE FF (TOWEL DISPOSABLE) ×2 IMPLANT

## 2021-02-14 NOTE — Anesthesia Preprocedure Evaluation (Signed)
Anesthesia Evaluation  Patient identified by MRN, date of birth, ID band Patient awake    Reviewed: Allergy & Precautions, NPO status , Patient's Chart, lab work & pertinent test results  Airway Mallampati: II  TM Distance: >3 FB Neck ROM: Full    Dental no notable dental hx.    Pulmonary neg pulmonary ROS,    Pulmonary exam normal breath sounds clear to auscultation       Cardiovascular hypertension, Pt. on medications negative cardio ROS Normal cardiovascular exam Rhythm:Regular Rate:Normal     Neuro/Psych negative neurological ROS  negative psych ROS   GI/Hepatic negative GI ROS, Neg liver ROS,   Endo/Other  negative endocrine ROSdiabetes, Type 2  Renal/GU negative Renal ROS  negative genitourinary   Musculoskeletal negative musculoskeletal ROS (+)   Abdominal   Peds negative pediatric ROS (+)  Hematology negative hematology ROS (+)   Anesthesia Other Findings   Reproductive/Obstetrics negative OB ROS                             Anesthesia Physical  Anesthesia Plan  ASA: III  Anesthesia Plan: Bier Block and Bier Block-LIDOCAINE ONLY   Post-op Pain Management:    Induction: Intravenous  PONV Risk Score and Plan: 1 and Ondansetron and Treatment may vary due to age or medical condition  Airway Management Planned: Simple Face Mask  Additional Equipment:   Intra-op Plan:   Post-operative Plan:   Informed Consent: I have reviewed the patients History and Physical, chart, labs and discussed the procedure including the risks, benefits and alternatives for the proposed anesthesia with the patient or authorized representative who has indicated his/her understanding and acceptance.     Dental advisory given  Plan Discussed with: CRNA  Anesthesia Plan Comments:         Anesthesia Quick Evaluation

## 2021-02-14 NOTE — Anesthesia Procedure Notes (Signed)
Anesthesia Regional Block: Bier block (IV Regional)   Pre-Anesthetic Checklist: , timeout performed,  Correct Patient, Correct Site, Correct Laterality,  Correct Procedure, Correct Position, site marked,  Risks and benefits discussed,  Surgical consent,  Pre-op evaluation,  At surgeon's request and post-op pain management  Laterality: Right  Prep: alcohol swabs       Needles:  Injection technique: Single-shot       Needle Gauge: 22     Additional Needles:   Procedures:,,,,, intact distal pulses, Esmarch exsanguination,  Single tourniquet utilized    Narrative:  Start time: 02/14/2021 1:16 PM End time: 02/14/2021 1:18 PM  Performed by: Personally

## 2021-02-14 NOTE — Discharge Instructions (Addendum)
   Post Anesthesia Home Care Instructions  Activity: Get plenty of rest for the remainder of the day. A responsible individual must stay with you for 24 hours following the procedure.  For the next 24 hours, DO NOT: -Drive a car -Paediatric nurse -Drink alcoholic beverages -Take any medication unless instructed by your physician -Make any legal decisions or sign important papers.  Meals: Start with liquid foods such as gelatin or soup. Progress to regular foods as tolerated. Avoid greasy, spicy, heavy foods. If nausea and/or vomiting occur, drink only clear liquids until the nausea and/or vomiting subsides. Call your physician if vomiting continues.  Special Instructions/Symptoms: Your throat may feel dry or sore from the anesthesia or the breathing tube placed in your throat during surgery. If this causes discomfort, gargle with warm salt water. The discomfort should disappear within 24 hours.           Keep dressing dry. Elevated wrist above heart. Apply ice to palm side of wrist two hours on and one half hour off for 48 hours. May Apply ice at night and go to sleep with out changing. Be sure to keep ice off fingers to prevent frost bite.  Return to office in two weeks for removal sutures from the right wrist.

## 2021-02-14 NOTE — Transfer of Care (Signed)
Immediate Anesthesia Transfer of Care Note  Patient: Timothy Clay  Procedure(s) Performed: RIGHT OPEN CARPAL TUNNEL RELEASE (Right: Wrist)  Patient Location: PACU  Anesthesia Type:MAC and Bier block  Level of Consciousness: drowsy  Airway & Oxygen Therapy: Patient Spontanous Breathing and Patient connected to face mask oxygen  Post-op Assessment: Report given to RN and Post -op Vital signs reviewed and stable  Post vital signs: Reviewed and stable  Last Vitals:  Vitals Value Taken Time  BP 105/72 02/14/21 1400  Temp    Pulse 47 02/14/21 1402  Resp 15 02/14/21 1402  SpO2 100 % 02/14/21 1402  Vitals shown include unvalidated device data.  Last Pain:  Vitals:   02/14/21 1119  TempSrc: Oral  PainSc: 0-No pain         Complications: No notable events documented.

## 2021-02-14 NOTE — Op Note (Signed)
02/14/2021  1:54 PM  PATIENT:  Timothy Clay  85 y.o. male  MRN: [id number]  PRE-OPERATIVE DIAGNOSIS:  Right carpal tunnel syndrome  POST-OPERATIVE DIAGNOSIS:  Right carpal tunnel syndrome  PROCEDURE:  Procedure(s): RIGHT OPEN CARPAL TUNNEL RELEASE   OPERATIVE REPORT     SURGEON:  Jessy Oto, MD      ASSISTANT:  [name], PA-C  (Present throughout the entire procedure and necessary for completion of procedure in a timely manner)      ANESTHESIA:  Regional Bier Block  right forearm Level, Supplemented with local Marcaine 0.5 % 7cc     COMPLICATIONS:  None.      TOURNIQUET TIME: 21 minutes at  210mHg   PROCEDURE: The patient was met in the holding area, and the appropriate right wrist identified and marked with x and my initials. The patient was then transported to OR and was placed on the operative table in a supine position. The patient was then placed under Bier block anesthesia without difficulty. The patient received appropriate preoperative antibiotic prophylaxis.     The right upper extremity was then prepped using sterile conditions and draped using sterile technique.  Time-out procedure was called and correct  .Using loope magnification and head lamp a 1.5 inch incision curved at the wrist crease with 15 blade scalpel.  Incision through skin and subcutaneous tissue to the volar forearm fascia, the palmaris longis tendon identified and retracted radially and then the  transverse carpal ligament and deep volar fascia of the distal forearm identified.  Fascia then carefully lifed and incised with Stevens scissors inline with the fourth digit. The skin and subcutaneous tissue retracted and the volar fascia divided under direct vision from distal to proximal. A freer elevator then carefully placed between the median nerve and the transverse carpal ligament protecting the  median nerve as the transverse carpal ligament was divided with a 15 blade scalpel in line with the  fourth digit. Retracting the distal skin and subcutaneous tissues distally under direct visualization the remaining portions of the transverse carpal ligament were divided with tenotomy scissors again in line with the fourth digit. The palmar fascia was then divided until the traversing superficial palmar arch was identified and preserved intact.  The motor branch of the median nerve was carefully examined and identified intact. Tourniquet was then released. Bleeding controlled with bipolar electrocautery. The incision was then irrigated with copious amounts of irrigant solution, No active bleeding was present. The incision closed with a single layer skin closure of 4-0 nylon horizontal mattress sutures. Dry dressing of adaptic, 4x4s held in place with sterile webril.  A well padded right volar splint applied with ace wrap.  The patient reactivated and returned to the PACU in good condition.  All instruments and sponge counts were correct.          JBasil Dess 02/14/2021, 1:54 PM

## 2021-02-14 NOTE — Brief Op Note (Signed)
02/14/2021  1:51 PM  PATIENT:  Jamse Arn  85 y.o. male  PRE-OPERATIVE DIAGNOSIS:  Right carpal tunnel syndrome  POST-OPERATIVE DIAGNOSIS:  Right carpal tunnel syndrome  PROCEDURE:  Procedure(s): RIGHT OPEN CARPAL TUNNEL RELEASE (Right)  SURGEON:  Surgeon(s) and Role:    * Jessy Oto, MD - Primary  PHYSICIAN ASSISTANT: [name], PA-C   ANESTHESIA:   local and regional  EBL:  5 mL   BLOOD ADMINISTERED:none  DRAINS: none   LOCAL MEDICATIONS USED:  MARCAINE 0.25% Amount: 7 ml  SPECIMEN:  No Specimen  DISPOSITION OF SPECIMEN:  N/A  COUNTS:  YES  TOURNIQUET:   Total Tourniquet Time Documented: Forearm (Right) - 22 minutes Total: Forearm (Right) - 22 minutes   DICTATION: .Viviann Spare Dictation  PLAN OF CARE: Discharge to home after PACU  PATIENT DISPOSITION:  PACU - hemodynamically stable.   Delay start of Pharmacological VTE agent (>24hrs) due to surgical blood loss or risk of bleeding: yes

## 2021-02-14 NOTE — Interval H&P Note (Signed)
History and Physical Interval Note:  02/14/2021 12:55 PM  Timothy Clay  has presented today for surgery, with the diagnosis of Right carpal tunnel syndrome.  The various methods of treatment have been discussed with the patient and family. After consideration of risks, benefits and other options for treatment, the patient has consented to  Procedure(s): RIGHT OPEN CARPAL TUNNEL RELEASE (Right) as a surgical intervention.  The patient's history has been reviewed, patient examined, no change in status, stable for surgery.  I have reviewed the patient's chart and labs.  Questions were answered to the patient's satisfaction.     Basil Dess

## 2021-02-15 NOTE — Anesthesia Postprocedure Evaluation (Signed)
Anesthesia Post Note  Patient: Timothy Clay  Procedure(s) Performed: RIGHT OPEN CARPAL TUNNEL RELEASE (Right: Wrist)     Patient location during evaluation: PACU Anesthesia Type: Bier Block and MAC Level of consciousness: awake and alert Pain management: pain level controlled Vital Signs Assessment: post-procedure vital signs reviewed and stable Respiratory status: spontaneous breathing, nonlabored ventilation, respiratory function stable and patient connected to nasal cannula oxygen Cardiovascular status: stable and blood pressure returned to baseline Postop Assessment: no apparent nausea or vomiting Anesthetic complications: no   No notable events documented.  Last Vitals:  Vitals:   02/14/21 1415 02/14/21 1445  BP: 118/82 129/87  Pulse: (!) 46 (!) 48  Resp: 14 16  Temp:  36.7 C  SpO2: 100% 99%    Last Pain:  Vitals:   02/14/21 1445  TempSrc:   PainSc: 0-No pain                 Donabelle Molden

## 2021-02-16 ENCOUNTER — Encounter (HOSPITAL_BASED_OUTPATIENT_CLINIC_OR_DEPARTMENT_OTHER): Payer: Self-pay | Admitting: Specialist

## 2021-02-20 ENCOUNTER — Ambulatory Visit: Payer: Medicare Other | Admitting: Specialist

## 2021-02-27 ENCOUNTER — Ambulatory Visit (INDEPENDENT_AMBULATORY_CARE_PROVIDER_SITE_OTHER): Payer: Medicare Other | Admitting: Surgery

## 2021-02-27 ENCOUNTER — Other Ambulatory Visit: Payer: Self-pay

## 2021-02-27 ENCOUNTER — Encounter: Payer: Self-pay | Admitting: Surgery

## 2021-02-27 VITALS — BP 131/78 | HR 53

## 2021-02-27 DIAGNOSIS — Z9889 Other specified postprocedural states: Secondary | ICD-10-CM

## 2021-02-27 NOTE — Progress Notes (Signed)
85year-old male who is 2 weeks status post right carpal tunnel release returns.  States that he is doing well.  Hand pain improved.  Continues to have some numbness and tingling as to be expected.   Exam Very pleasant black male alert and oriented in no acute distress.  Accompanied by his daughter today.  Right hand wound looks good.  Sutures removed and Steri-Strips applied.  No drainage or signs of infection.   Plan Patient advised to elevate hand is much as possible to decrease any swelling.  Work on gentle range of motion of his fingers and wrist.  He is recovering very well from left carpal tunnel release.  Given a removable wrist splint that he can use over the next few weeks.  Follow-up with me in 4 weeks for recheck.  Return sooner if needed.

## 2021-02-28 ENCOUNTER — Other Ambulatory Visit: Payer: Self-pay | Admitting: Surgery

## 2021-02-28 MED ORDER — HYDROCODONE-ACETAMINOPHEN 5-325 MG PO TABS: 1.0000 | ORAL_TABLET | Freq: Three times a day (TID) | ORAL | 0 refills | Status: DC | PRN

## 2021-03-04 ENCOUNTER — Telehealth: Payer: Self-pay | Admitting: Radiology

## 2021-03-04 NOTE — Telephone Encounter (Signed)
I called Dr. Theodoro Grist office to see if patient is able to take NSAIDS.  Voicemail picks up and you have to leave a message. I asked for return call to advise.  Dr. Teryl Lucy phone--[phone]

## 2021-03-05 ENCOUNTER — Ambulatory Visit (INDEPENDENT_AMBULATORY_CARE_PROVIDER_SITE_OTHER): Payer: Medicare Other | Admitting: Surgery

## 2021-03-05 DIAGNOSIS — Z9889 Other specified postprocedural states: Secondary | ICD-10-CM

## 2021-03-05 NOTE — Telephone Encounter (Signed)
I called office again. Spoke with receptionist who states original message has been sent to [place], who is not in the office at the moment. I explained that patient is post op and we need to know if he is able to take NSAIDS as he is still having pain even though he is taking pain medication. She will get message to [place] and have her call me back to advise.

## 2021-03-06 NOTE — Telephone Encounter (Signed)
I spoke with Dr. Teryl Lucy. He would like to avoid NSAIDS if possible due to patient's age and other risk factors.

## 2021-03-10 ENCOUNTER — Telehealth: Payer: Self-pay | Admitting: Radiology

## 2021-03-10 ENCOUNTER — Encounter: Payer: Self-pay | Admitting: Surgery

## 2021-03-10 ENCOUNTER — Encounter (HOSPITAL_BASED_OUTPATIENT_CLINIC_OR_DEPARTMENT_OTHER): Payer: Self-pay | Admitting: Emergency Medicine

## 2021-03-10 ENCOUNTER — Emergency Department (HOSPITAL_BASED_OUTPATIENT_CLINIC_OR_DEPARTMENT_OTHER)
Admission: EM | Admit: 2021-03-10 | Discharge: 2021-03-10 | Disposition: A | Discharge status: Home / Self Care | Payer: Medicare Other | Attending: Emergency Medicine | Admitting: Emergency Medicine

## 2021-03-10 ENCOUNTER — Other Ambulatory Visit: Payer: Self-pay

## 2021-03-10 DIAGNOSIS — M79641 Pain in right hand: Secondary | ICD-10-CM | POA: Insufficient documentation

## 2021-03-10 DIAGNOSIS — Z5321 Procedure and treatment not carried out due to patient leaving prior to being seen by health care provider: Secondary | ICD-10-CM | POA: Insufficient documentation

## 2021-03-10 NOTE — ED Notes (Signed)
Not present during role call.

## 2021-03-10 NOTE — ED Triage Notes (Addendum)
Pt arrives to ED with c/o of right hand pain x1 week. Pt with recent right carpel tunnel surgery x3.5 week ago. Pt reports the pain is intermittent and worse at night. Pain is worse in middle and ring fingers. Swelling to fingers. Pt is currently in wrist brace. Currently pain is low.
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
## 2021-03-10 NOTE — Telephone Encounter (Signed)
Patients family is calling states that the patient is crying out and screaming with pain in his right hand. Per Jeneen Rinks, he has done all that he can with him. Jeneen Rinks gave him some exercises to do and he has been doing them, he is not sleeping due to the pain.  They would like to know if there is anything else they can try for him? He is scheduled to come in on Thursday to be seen by Dr. Louanne Skye. Family is going to take him to the ER here.

## 2021-03-11 ENCOUNTER — Encounter: Payer: Self-pay | Admitting: Specialist

## 2021-03-11 ENCOUNTER — Ambulatory Visit (INDEPENDENT_AMBULATORY_CARE_PROVIDER_SITE_OTHER): Payer: Medicare Other | Admitting: Specialist

## 2021-03-11 VITALS — BP 154/88 | HR 56 | Ht 74.0 in | Wt 190.0 lb

## 2021-03-11 DIAGNOSIS — G90511 Complex regional pain syndrome I of right upper limb: Secondary | ICD-10-CM

## 2021-03-11 DIAGNOSIS — G5691 Unspecified mononeuropathy of right upper limb: Secondary | ICD-10-CM

## 2021-03-11 DIAGNOSIS — Z9889 Other specified postprocedural states: Secondary | ICD-10-CM

## 2021-03-11 MED ORDER — METHYLPREDNISOLONE 4 MG PO TBPK: ORAL_TABLET | ORAL | 0 refills | Status: DC

## 2021-03-11 MED ORDER — AMITRIPTYLINE HCL 10 MG PO TABS: 10.0000 mg | ORAL_TABLET | Freq: Every day | ORAL | 2 refills | Status: DC

## 2021-03-11 MED ORDER — HYDROCODONE-ACETAMINOPHEN 5-325 MG PO TABS: 1.0000 | ORAL_TABLET | ORAL | 0 refills | Status: AC | PRN

## 2021-03-11 MED ORDER — PREGABALIN 75 MG PO CAPS: 75.0000 mg | ORAL_CAPSULE | Freq: Two times a day (BID) | ORAL | 2 refills | Status: DC

## 2021-03-11 NOTE — Progress Notes (Signed)
   Post-Op Visit Note   Patient: Timothy Clay           Date of Birth: 1935/11/06           MRN: [id number] Visit Date: 03/11/2021 PCP: Andres Shad, MD   Assessment & Plan:3 1/2 weeks post right open Carpal Tunnel Release.  Chief Complaint: Severe right hand pain and swelling.  Visit Diagnoses: No diagnosis found. 85 year old right handed male post left open CTS for severe CTS and now 3.5 weeks post op right open Carpal tunnel surgery. Surgery was uneventful and he had removal of sutures on post day  He has been able to drive then over the last 1.5 weeks there is increasing pain Motor right hand is intact and passive ROM is intact right hand. No swelling at the right wrist incision is healed and there is no warmth, erythrema of induration. The right hand is generally mildly swollen. Radial a pulse is 2+ Ulna artery pulse is not palpable, no significant swelling or ecchymosis.  Plan: Right hand intense occupational therapy, elastic garment to help decrease swelling Lyrica for neurogenic pain. Medrol dose pak for relieving nerve swelling.  Splint as needed Elevate to decrease swelling. Right hand complex regional pain syndrome, eval and treat, AROM and PROM, strengthening and grip strengthening, Compression garment to decrease edema. He is on medrol dose pak, taking lyrica and is being scheduled for a right stellate ganglion block. Follow-Up Instructions: No follow-ups on file.   Orders:  No orders of the defined types were placed in this encounter.  No orders of the defined types were placed in this encounter.   Imaging: No results found.  PMFS History: Patient Active Problem List   Diagnosis Date Noted   Carpal tunnel syndrome, right upper limb 02/14/2021    Priority: High    Class: Chronic   Carpal tunnel syndrome, bilateral 01/13/2021    Priority: High    Class: Chronic   Gout 01/03/2021   Type 2 diabetes mellitus ([place]) 01/03/2021   Hypertension  01/03/2021   Past Medical History:  Diagnosis Date   Cancer [facility])    prostate cancer, not treating just watching numbers   Chronic kidney disease    CKD   Diabetes mellitus without complication ([place])    no meds now   Gout    Hypertension    Memory deficit     No family history on file.  Past Surgical History:  Procedure Laterality Date   BACK SURGERY     CARPAL TUNNEL RELEASE Left 01/13/2021   Procedure: LEFT OPEN CARPAL TUNNEL RELEASE;  Surgeon: Jessy Oto, MD;  Location: [place];  Service: Orthopedics;  Laterality: Left;   CARPAL TUNNEL RELEASE Right 02/14/2021   Procedure: RIGHT OPEN CARPAL TUNNEL RELEASE;  Surgeon: Jessy Oto, MD;  Location: [place];  Service: Orthopedics;  Laterality: Right;   COLONOSCOPY W/ BIOPSIES AND POLYPECTOMY     KNEE SURGERY     TONSILLECTOMY     Social History   Occupational History   Not on file  Tobacco Use   Smoking status: Never   Smokeless tobacco: Never  Vaping Use   Vaping Use: Never used  Substance and Sexual Activity   Alcohol use: Never   Drug use: Never   Sexual activity: Not on file

## 2021-03-11 NOTE — Patient Instructions (Addendum)
Plan: Right hand intense occupational therapy, elastic garment to help decrease swelling Lyrica for neurogenic pain. Medrol dose pak for relieving nerve swelling.  Splint as needed Elevate to decrease swelling. Hydrocodone for pain 1-2 every 4-6 prn pain.  Right hand complex regional pain syndrome, eval and treat, AROM and PROM, strengthening and grip strengthening, Compression garment to decrease edema. He is on medrol dose pak, taking lyrica and is being scheduled for a right stellate ganglion block.

## 2021-03-12 ENCOUNTER — Ambulatory Visit: Payer: Medicare Other | Admitting: Specialist

## 2021-03-12 ENCOUNTER — Telehealth: Payer: Self-pay | Admitting: Radiology

## 2021-03-12 NOTE — Telephone Encounter (Signed)
Was unable to talk to him before I left

## 2021-03-12 NOTE — Telephone Encounter (Signed)
Patient would like to know if he can change OT to [facility] due to the fact that he doesn't drive and will not have a way to get there 3 times per week.  He would like to try either [facility] [phone] or [place].  Please advise. OK for HHOT?

## 2021-03-12 NOTE — Telephone Encounter (Signed)
Please advise 

## 2021-03-13 ENCOUNTER — Ambulatory Visit: Payer: Medicare Other | Admitting: Specialist

## 2021-03-19 ENCOUNTER — Encounter: Payer: Self-pay | Admitting: Surgery

## 2021-03-19 ENCOUNTER — Ambulatory Visit (INDEPENDENT_AMBULATORY_CARE_PROVIDER_SITE_OTHER): Payer: Medicare Other | Admitting: Surgery

## 2021-03-19 VITALS — Ht 74.0 in | Wt 190.0 lb

## 2021-03-19 DIAGNOSIS — Z9889 Other specified postprocedural states: Secondary | ICD-10-CM

## 2021-03-19 DIAGNOSIS — M5412 Radiculopathy, cervical region: Secondary | ICD-10-CM

## 2021-03-20 ENCOUNTER — Ambulatory Visit
Admission: RE | Admit: 2021-03-20 | Discharge: 2021-03-20 | Disposition: A | Discharge status: Home / Self Care | Payer: Medicare Other | Source: Ambulatory Visit | Attending: Surgery | Admitting: Surgery

## 2021-03-20 DIAGNOSIS — M5412 Radiculopathy, cervical region: Secondary | ICD-10-CM

## 2021-03-21 ENCOUNTER — Telehealth: Payer: Self-pay | Admitting: Specialist

## 2021-03-21 NOTE — Telephone Encounter (Signed)
Ronalee Belts (physical therapist) from [facility] called with updated info. Ronalee Belts states to give pt one more pt session and pt will start OT next wk for approximately 2 months. No call back needed. If any further questions call Ronalee Belts at [phone].

## 2021-03-24 ENCOUNTER — Encounter: Payer: Self-pay | Admitting: Physical Medicine and Rehabilitation

## 2021-03-24 ENCOUNTER — Other Ambulatory Visit: Payer: Self-pay

## 2021-03-24 ENCOUNTER — Ambulatory Visit (INDEPENDENT_AMBULATORY_CARE_PROVIDER_SITE_OTHER): Payer: Medicare Other | Admitting: Physical Medicine and Rehabilitation

## 2021-03-24 ENCOUNTER — Ambulatory Visit: Payer: Self-pay

## 2021-03-24 VITALS — BP 118/75 | HR 54

## 2021-03-24 DIAGNOSIS — G90511 Complex regional pain syndrome I of right upper limb: Secondary | ICD-10-CM

## 2021-03-24 MED ORDER — BUPIVACAINE HCL 0.25 % IJ SOLN
2.0000 mL | Freq: Once | INTRAMUSCULAR | Status: AC
  Administered 2021-03-24: 2 mL

## 2021-03-24 NOTE — Progress Notes (Signed)
Pt state neck pain that travels down hand right hand. Pt state at night his pain gets worse. Pt state he takes over the counter pain meds to help ease his pain.  Numeric Pain Rating Scale and Functional Assessment Average Pain 10   In the last MONTH (on 0-10 scale) has pain interfered with the following?  1. General activity like being  able to carry out your everyday physical activities such as walking, climbing stairs, carrying groceries, or moving a chair?  Rating(10)   +Driver, -BT, -Dye Allergies.

## 2021-03-24 NOTE — Patient Instructions (Signed)

## 2021-03-26 NOTE — Progress Notes (Signed)
Timothy Clay - 85 y.o. male MRN [id number]  Date of birth: 18-Sep-1935  Office Visit Note: Visit Date: 03/24/2021 PCP: Timothy Shad, MD Referred by: Timothy Clay, *  Subjective: Chief Complaint  Patient presents with   Neck - Pain   Right Hand - Pain   HPI:  Timothy Clay is a 85 y.o. male who comes in today At the request at the request of Dr. Basil Clay for right stellate ganglion block with fluoroscopic guidance.  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  Subsequently underwent release of the carpal tunnel on both sides.  The left hand has done well in terms of pain relief.  As of note however he had very severe nerve compression and injury with axonal loss and was not going to get complete recovery from decompression.  He still on the left has atrophy of the thenar eminence with numbness in the median nerve distribution.  The pain is better.  Post surgery on the right he has had increased pain of the right arm and hand.  Patient is very hard of hearing he does have family members present that do help with [place] talking with him and we were able to talk with him at length about his symptoms.  He denies any allodynia or swelling or any increased sweating or temperature change.  He has pain with trying to flex his fingers into a fist.  He reports that if he uses his left hand to push his fingers into a fist it does not seem to hurt but if he tries to actively do that it seems to be painful.  This seems to be more tendon related.  He continues to have numbness but he actually feels like that may be somewhat better.  The pain has been quite severe and its constant.  He does not seem to make criteria of the [place] criteria for complex regional pain syndrome but Dr. Louanne Clay does feel like this may be an early symptom.  I think is reasonable to try stellate  ganglion block but if he does not get much relief from that I would have him follow-up with Dr. Louanne Clay for continued management.  ROS Otherwise per HPI.  Assessment & Plan: Visit Diagnoses:    ICD-10-CM   1. Complex regional pain syndrome type 1 of right upper extremity  G90.511 XR C-ARM NO REPORT    Nerve Block    bupivacaine (MARCAINE) 0.25 % (with pres) injection 2 mL      Plan: No additional findings.   Meds & Orders:  Meds ordered this encounter  Medications   bupivacaine (MARCAINE) 0.25 % (with pres) injection 2 mL    Orders Placed This Encounter  Procedures   Nerve Block   XR C-ARM NO REPORT    Follow-up: Return for visit to requesting physician as needed.   Procedures: No procedures performed  Stellate Ganglion Block with Fluoroscopic Guidance  Patient: Timothy Clay      Date of Birth: 12-10-1935 MRN: [id number] PCP: Timothy Shad, MD      Visit Date: 03/24/2021   Universal Protocol:     Consent Given By: the patient  Position:  SUPINE   Additional Comments: Vital signs were monitored before and after the procedure. Patient was prepped and draped in the usual sterile fashion. The correct patient, procedure, and site was verified.   Injection  Procedure Details:  Procedure Site One Meds Administered:  Meds ordered this encounter  Medications   bupivacaine (MARCAINE) 0.25 % (with pres) injection 2 mL     Laterality: Right  Location/Site:  C6-7  Needle size: 25 G  Needle type: spinal needle  Findings:  -Contrast Used: 2 mL iohexol 180 mg iodine/mL   -Comments:  Good biplanar imaging showing well-placed needle along the anterior lateral aspect of the inferior part of the vertebral body and excellent flow of contrast reaching down to the C7 vertebral body. Post procedure patient did have increased temperature on the right arm and hand.  Difficult to see if there was any ptosis of the eyes.  Procedure Details: The C-arm was initially  positioned in a caudal tilt with a 45 obliquity to the side mentioned above. The C-arm was then positioned to achieve the largest diameter of the foramen as well as adjusting the caudal tilt to achieve the squared off joint line at the C6 vertebral body.  The region overlying the C6 vertebral body at or just medial to the uncovertebral joint line was localized under fluoroscopic guidance and the skin over the target area was anesthetized with 1 ml of 1% Lidocaine without epinephrine. A #25 gauge, bent spinal needle was inserted down to the target area of the C6 vertebral body using biplanar imaging. Needle placement was verified both in an oblique view and AP view which is the safety view. A 2-3 ml. volume of Omnipaque-240 was injected. No intravascular injection was seen. Radiographs were obtained for documentation purposes. Then, a 1.0 ml. volume of 0.25% Marcaine was slowly injected after negative aspirate.  Additional Omnipaque was injected again to confirm the position followed by Marcaine for a total of three more times over a 5-7 minute period.  The vital signs remained stable and the patient did not have any metallic taste sensation. Throughout the procedure, a total of 5 ml. of 0.25% Marcaine was injected.    Additional Comments:  The patient tolerated the procedure well Dressing: Band-Aid    Post-procedure details: Patient was observed during the procedure. Post-procedure instructions were reviewed. Follow-Up Instructions: as per written instructions given to patient. Patient left the clinic in stable condition.    Clinical History: No specialty comments available.     Objective:  VS:  HT:    WT:   BMI:     BP:118/75  HR:(!) 54bpm  TEMP: ( )  RESP:  Physical Exam Vitals and nursing note reviewed.  Constitutional:      General: He is not in acute distress.    Appearance: Normal appearance. He is not ill-appearing.  HENT:     Head: Normocephalic and atraumatic.     Right  Ear: External ear normal.     Left Ear: External ear normal.  Eyes:     Extraocular Movements: Extraocular movements intact.  Cardiovascular:     Rate and Rhythm: Normal rate.     Pulses: Normal pulses.  Abdominal:     General: There is no distension.     Palpations: Abdomen is soft.  Musculoskeletal:        General: No signs of injury.     Cervical back: Neck supple. Tenderness present. No rigidity.     Right lower leg: No edema.     Left lower leg: No edema.     Comments: Patient has good strength in the upper extremities with 5 out of 5 strength in wrist extension long finger flexion APB.  Long  finger flexion on the right causes concordant pain.  He has atrophy of the left more than right APB but no intrinsic hand muscle atrophy.  He has decreased sensation in the bilateral median nerve distribution.  He does not have any allodynia swelling or temperature change or increased sweating on the right more than the left.  No discoloration.  Lymphadenopathy:     Cervical: No cervical adenopathy.  Skin:    Findings: No erythema or rash.  Neurological:     General: No focal deficit present.     Mental Status: He is alert and oriented to person, place, and time.     Sensory: No sensory deficit.     Motor: No weakness or abnormal muscle tone.     Coordination: Coordination normal.  Psychiatric:        Mood and Affect: Mood normal.        Behavior: Behavior normal.     Imaging: No results found.

## 2021-03-26 NOTE — Procedures (Signed)
Stellate Ganglion Block with Fluoroscopic Guidance  Patient: Timothy Clay      Date of Birth: 1936-05-26 MRN: [id number] PCP: Andres Shad, MD      Visit Date: 03/24/2021   Universal Protocol:     Consent Given By: the patient  Position:  SUPINE   Additional Comments: Vital signs were monitored before and after the procedure. Patient was prepped and draped in the usual sterile fashion. The correct patient, procedure, and site was verified.   Injection Procedure Details:  Procedure Site One Meds Administered:  Meds ordered this encounter  Medications   bupivacaine (MARCAINE) 0.25 % (with pres) injection 2 mL     Laterality: Right  Location/Site:  C6-7  Needle size: 25 G  Needle type: spinal needle  Findings:  -Contrast Used: 2 mL iohexol 180 mg iodine/mL   -Comments:  Good biplanar imaging showing well-placed needle along the anterior lateral aspect of the inferior part of the vertebral body and excellent flow of contrast reaching down to the C7 vertebral body. Post procedure patient did have increased temperature on the right arm and hand.  Difficult to see if there was any ptosis of the eyes.  Procedure Details: The C-arm was initially positioned in a caudal tilt with a 45 obliquity to the side mentioned above. The C-arm was then positioned to achieve the largest diameter of the foramen as well as adjusting the caudal tilt to achieve the squared off joint line at the C6 vertebral body.  The region overlying the C6 vertebral body at or just medial to the uncovertebral joint line was localized under fluoroscopic guidance and the skin over the target area was anesthetized with 1 ml of 1% Lidocaine without epinephrine. A #25 gauge, bent spinal needle was inserted down to the target area of the C6 vertebral body using biplanar imaging. Needle placement was verified both in an oblique view and AP view which is the safety view. A 2-3 ml. volume of Omnipaque-240 was  injected. No intravascular injection was seen. Radiographs were obtained for documentation purposes. Then, a 1.0 ml. volume of 0.25% Marcaine was slowly injected after negative aspirate.  Additional Omnipaque was injected again to confirm the position followed by Marcaine for a total of three more times over a 5-7 minute period.  The vital signs remained stable and the patient did not have any metallic taste sensation. Throughout the procedure, a total of 5 ml. of 0.25% Marcaine was injected.    Additional Comments:  The patient tolerated the procedure well Dressing: Band-Aid    Post-procedure details: Patient was observed during the procedure. Post-procedure instructions were reviewed. Follow-Up Instructions: as per written instructions given to patient. Patient left the clinic in stable condition.

## 2021-04-02 ENCOUNTER — Encounter: Payer: Medicare Other | Admitting: Surgery

## 2021-04-03 ENCOUNTER — Ambulatory Visit (INDEPENDENT_AMBULATORY_CARE_PROVIDER_SITE_OTHER): Payer: Medicare Other | Admitting: Specialist

## 2021-04-03 ENCOUNTER — Encounter: Payer: Self-pay | Admitting: Specialist

## 2021-04-03 ENCOUNTER — Ambulatory Visit: Payer: Self-pay

## 2021-04-03 ENCOUNTER — Other Ambulatory Visit: Payer: Self-pay

## 2021-04-03 VITALS — BP 137/80 | HR 52 | Ht 74.0 in | Wt 190.0 lb

## 2021-04-03 DIAGNOSIS — G90511 Complex regional pain syndrome I of right upper limb: Secondary | ICD-10-CM

## 2021-04-03 MED ORDER — HYDROCODONE-ACETAMINOPHEN 5-325 MG PO TABS: 1.0000 | ORAL_TABLET | Freq: Three times a day (TID) | ORAL | 0 refills | Status: DC | PRN

## 2021-04-03 MED ORDER — AMITRIPTYLINE HCL 25 MG PO TABS: 25.0000 mg | ORAL_TABLET | Freq: Every day | ORAL | 3 refills | Status: AC

## 2021-04-03 MED ORDER — PREGABALIN 75 MG PO CAPS: 75.0000 mg | ORAL_CAPSULE | Freq: Three times a day (TID) | ORAL | 2 refills | Status: DC

## 2021-04-03 NOTE — Patient Instructions (Signed)
  Plan: Continue with home therapy program, needs a compression stocking for the right hand to decrease swelling. PT with both active and passive ROM and strengthening.  Increase lyrica to 75 mg TID from BID. Increase elavil to 25 mg qhs. Vicodin for pain.

## 2021-04-03 NOTE — Progress Notes (Signed)
Post-Op Visit Note   Patient: Timothy Clay           Date of Birth: 07-16-1936           MRN: [id number] Visit Date: 04/03/2021 PCP: Andres Shad, MD   Assessment & Plan:6 weeks post right Carpal tunnel release, right complex regional pain syndrome.   Chief Complaint:  Chief Complaint  Patient presents with   Neck - Follow-up    MRI Review, Stellate Ganglion block Rt C6-7, cant tell if it did anything at all.   Right Hand - Follow-up    Has deep pain in the hand and says it is swollen  Right hand with pain, right hand radial 3 fingers with mild vasodilitation. Radial a 2+, ulna artery  0 Radiographs with calcified TFCC, mild osteopenia, no acute changes  Visit Diagnoses:  1. Complex regional pain syndrome type 1 of right upper extremity     Plan: Continue with home therapy program, needs a compression stocking for the right hand to decrease swelling. PT with both active and passive ROM and strengthening.  Increase lyrica to 75 mg TID from BID. Increase elavil to 25 mg qhs. Vicodin for pain. Follow-Up Instructions: No follow-ups on file.   Orders:  Orders Placed This Encounter  Procedures   XR Wrist Complete Right   VAS [place] DOP BILAT COMP TOS DIGITS REYNAUD   Meds ordered this encounter  Medications   HYDROcodone-acetaminophen (NORCO/VICODIN) 5-325 MG tablet    Sig: Take 1 tablet by mouth every 8 (eight) hours as needed for moderate pain.    Dispense:  40 tablet    Refill:  0   pregabalin (LYRICA) 75 MG capsule    Sig: Take 1 capsule (75 mg total) by mouth 3 (three) times daily.    Dispense:  90 capsule    Refill:  2    Imaging: No results found.  PMFS History: Patient Active Problem List   Diagnosis Date Noted   Carpal tunnel syndrome, right upper limb 02/14/2021    Priority: High    Class: Chronic   Carpal tunnel syndrome, bilateral 01/13/2021    Priority: High    Class: Chronic   Gout 01/03/2021   Type 2 diabetes mellitus ([place]) 01/03/2021    Hypertension 01/03/2021   Past Medical History:  Diagnosis Date   Cancer [facility])    prostate cancer, not treating just watching numbers   Chronic kidney disease    CKD   Diabetes mellitus without complication ([place])    no meds now   Gout    Hypertension    Memory deficit     History reviewed. No pertinent family history.  Past Surgical History:  Procedure Laterality Date   BACK SURGERY     CARPAL TUNNEL RELEASE Left 01/13/2021   Procedure: LEFT OPEN CARPAL TUNNEL RELEASE;  Surgeon: Jessy Oto, MD;  Location: [place];  Service: Orthopedics;  Laterality: Left;   CARPAL TUNNEL RELEASE Right 02/14/2021   Procedure: RIGHT OPEN CARPAL TUNNEL RELEASE;  Surgeon: Jessy Oto, MD;  Location: [place];  Service: Orthopedics;  Laterality: Right;   COLONOSCOPY W/ BIOPSIES AND POLYPECTOMY     KNEE SURGERY     TONSILLECTOMY     Social History   Occupational History   Not on file  Tobacco Use   Smoking status: Never   Smokeless tobacco: Never  Vaping Use   Vaping Use: Never used  Substance and Sexual Activity   Alcohol  use: Never   Drug use: Never   Sexual activity: Not on file

## 2021-04-09 ENCOUNTER — Ambulatory Visit (HOSPITAL_COMMUNITY)
Admission: RE | Admit: 2021-04-09 | Discharge: 2021-04-09 | Disposition: A | Discharge status: Home / Self Care | Payer: Medicare Other | Source: Ambulatory Visit | Attending: Specialist | Admitting: Specialist

## 2021-04-09 ENCOUNTER — Other Ambulatory Visit: Payer: Self-pay

## 2021-04-09 DIAGNOSIS — G90511 Complex regional pain syndrome I of right upper limb: Secondary | ICD-10-CM | POA: Insufficient documentation

## 2021-04-10 ENCOUNTER — Telehealth: Payer: Self-pay

## 2021-04-10 NOTE — Telephone Encounter (Signed)
Patient would like to know his U/S results.   CB: [phone]-Tanya

## 2021-04-10 NOTE — Telephone Encounter (Signed)
Advised that per Jeneen Rinks he should keep his appointment on 05/14/21

## 2021-04-14 ENCOUNTER — Other Ambulatory Visit: Payer: Self-pay | Admitting: Radiology

## 2021-04-14 ENCOUNTER — Telehealth: Payer: Self-pay

## 2021-04-14 DIAGNOSIS — G5691 Unspecified mononeuropathy of right upper limb: Secondary | ICD-10-CM

## 2021-04-14 DIAGNOSIS — G90511 Complex regional pain syndrome I of right upper limb: Secondary | ICD-10-CM

## 2021-04-14 DIAGNOSIS — Z9889 Other specified postprocedural states: Secondary | ICD-10-CM

## 2021-04-14 NOTE — Telephone Encounter (Signed)
Patient states his hand is swollen and he has been doing OT and moving it but he wants to see the doctor.   CB: [phone]-Tanya   (April's Granddad)

## 2021-04-14 NOTE — Telephone Encounter (Signed)
Per Dr. Louanne Skye he would like to order and MRI of the right wrist with and without contrast, I called and advised Lavella Lemons of this

## 2021-04-14 NOTE — Telephone Encounter (Signed)
I called and spoke with Timothy Clay, and adviesd per Dr. Louanne Skye that they should get him a compression glove to wear on that hand, that they can ask OT if they have one or they can be got from the pharmacies.  She states that they will get one for him.

## 2021-04-22 NOTE — Progress Notes (Signed)
Office Visit Note   Patient: Timothy Clay           Date of Birth: Jan 03, 1936           MRN: [id number] Visit Date: 03/19/2021              Requested by: Andres Shad, MD [address] [place] [address] PCP: Andres Shad, MD   Assessment & Plan: Visit Diagnoses:  1. Radiculopathy, cervical region   2. S/P carpal tunnel release right     Plan: The patient's ongoing symptoms and history of multilevel cervical spondylosis recommend getting a cervical MRI to rule out HNP/stenosis.  Follow with Dr. Louanne Skye for completion to discuss results and further treatment options.  Follow-Up Instructions: No follow-ups on file.   Orders:  Orders Placed This Encounter  Procedures   MR Cervical Spine w/o contrast   No orders of the defined types were placed in this encounter.     Procedures: No procedures performed   Clinical Data: No additional findings.   Subjective: Chief Complaint  Patient presents with   Right Hand - Follow-up    Very painful, pain shoots thru it    HPI 85 year old white male who is status post right carpal tunnel release February 14, 2021 and left carpal tunnel release Jan 13, 2021 returns.  He continues have ongoing pain in his right hand.  When initially seen patient I did get a cervical spine x-ray and he did have multilevel cervical spondylosis.    Objective: Vital Signs: Ht '6\' 2"'$  (1.88 m)   Wt 190 lb (86.2 kg)   BMI 24.39 kg/m   Physical Exam Patient has positive right greater than left brachial plexus and trapezius tenderness.  Positive Tinel's over the right cubital tunnel. Ortho Exam  Specialty Comments:  No specialty comments available.  Imaging: No results found.   PMFS History: Patient Active Problem List   Diagnosis Date Noted   Carpal tunnel syndrome, right upper limb 02/14/2021    Class: Chronic   Carpal tunnel syndrome, bilateral 01/13/2021    Class: Chronic   Gout 01/03/2021   Type 2 diabetes  mellitus ([place]) 01/03/2021   Hypertension 01/03/2021   Past Medical History:  Diagnosis Date   Cancer [facility])    prostate cancer, not treating just watching numbers   Chronic kidney disease    CKD   Diabetes mellitus without complication ([place])    no meds now   Gout    Hypertension    Memory deficit     No family history on file.  Past Surgical History:  Procedure Laterality Date   BACK SURGERY     CARPAL TUNNEL RELEASE Left 01/13/2021   Procedure: LEFT OPEN CARPAL TUNNEL RELEASE;  Surgeon: Jessy Oto, MD;  Location: [place];  Service: Orthopedics;  Laterality: Left;   CARPAL TUNNEL RELEASE Right 02/14/2021   Procedure: RIGHT OPEN CARPAL TUNNEL RELEASE;  Surgeon: Jessy Oto, MD;  Location: [place];  Service: Orthopedics;  Laterality: Right;   COLONOSCOPY W/ BIOPSIES AND POLYPECTOMY     KNEE SURGERY     TONSILLECTOMY     Social History   Occupational History   Not on file  Tobacco Use   Smoking status: Never   Smokeless tobacco: Never  Vaping Use   Vaping Use: Never used  Substance and Sexual Activity   Alcohol use: Never   Drug use: Never   Sexual activity: Not on file

## 2021-04-26 ENCOUNTER — Other Ambulatory Visit: Payer: Self-pay

## 2021-04-26 ENCOUNTER — Ambulatory Visit
Admission: RE | Admit: 2021-04-26 | Discharge: 2021-04-26 | Disposition: A | Discharge status: Home / Self Care | Payer: Medicare Other | Source: Ambulatory Visit | Attending: Specialist | Admitting: Specialist

## 2021-04-26 DIAGNOSIS — G90511 Complex regional pain syndrome I of right upper limb: Secondary | ICD-10-CM

## 2021-04-26 DIAGNOSIS — Z9889 Other specified postprocedural states: Secondary | ICD-10-CM

## 2021-04-26 DIAGNOSIS — G5691 Unspecified mononeuropathy of right upper limb: Secondary | ICD-10-CM

## 2021-04-26 MED ORDER — GADOBENATE DIMEGLUMINE 529 MG/ML IV SOLN
17.0000 mL | Freq: Once | INTRAVENOUS | Status: AC | PRN
  Administered 2021-04-26: 17 mL via INTRAVENOUS

## 2021-05-14 ENCOUNTER — Other Ambulatory Visit: Payer: Self-pay

## 2021-05-14 ENCOUNTER — Ambulatory Visit (INDEPENDENT_AMBULATORY_CARE_PROVIDER_SITE_OTHER): Payer: Medicare Other | Admitting: Specialist

## 2021-05-14 ENCOUNTER — Encounter: Payer: Self-pay | Admitting: Specialist

## 2021-05-14 VITALS — BP 165/79 | HR 55 | Ht 74.0 in | Wt 190.0 lb

## 2021-05-14 DIAGNOSIS — G90511 Complex regional pain syndrome I of right upper limb: Secondary | ICD-10-CM

## 2021-05-14 DIAGNOSIS — G5641 Causalgia of right upper limb: Secondary | ICD-10-CM

## 2021-05-14 MED ORDER — PREGABALIN 75 MG PO CAPS: ORAL_CAPSULE | ORAL | 3 refills | Status: DC

## 2021-05-14 MED ORDER — ETODOLAC 400 MG PO TABS: 400.0000 mg | ORAL_TABLET | Freq: Two times a day (BID) | ORAL | 3 refills | Status: AC

## 2021-05-14 NOTE — Progress Notes (Signed)
Office Visit Note   Patient: Timothy Clay           Date of Birth: 1935-10-16           MRN: [id number] Visit Date: 05/14/2021              Requested by: Andres Shad, MD [address] [place] [id number] PCP: Andres Shad, MD   Assessment & Plan: Visit Diagnoses:  1. Complex regional pain syndrome type 2 of right upper extremity     Plan: Home therapy for swelling and stiffness right hand post carpal tunnel release. Start use of arthritis medication like motrin, naprosyn or lodine.  Increase the lyrica to decrease the nerve pain. Appointment for second opinion with Dr. Audria Nine.  Follow-Up Instructions: Return in about 4 weeks (around 06/11/2021), or Please schedule to see Dr. Tempie Donning for a second opinion as soon as possible, for Return to see me in 3-4 weeks. .   Orders:  Orders Placed This Encounter  Procedures   Ambulatory referral to [facility] ordered this encounter  Medications   etodolac (LODINE) 400 MG tablet    Sig: Take 1 tablet (400 mg total) by mouth 2 (two) times daily.    Dispense:  60 tablet    Refill:  3   DISCONTD: pregabalin (LYRICA) 75 MG capsule    Sig: Take 2 capsules at night one in the morning and one in the afternoon for 4 days then 2 capsules at night and 2 capsules in the morning and one in the afternoon for 4 days then 2 capsules three times a day    Dispense:  180 capsule    Refill:  3   DISCONTD: pregabalin (LYRICA) 75 MG capsule    Sig: Take 2 capsules at night one in the morning and one in the afternoon for 4 days  Then take 2 capsules at night and 2 capsules in the morning and one in the afternoon for 4 days  Then take 2 capsules three times a day    Dispense:  180 capsule    Refill:  3   DISCONTD: pregabalin (LYRICA) 75 MG capsule    Sig: Take 2 capsules at night one in the morning and one in the afternoon for 4 days  Then take 2 capsules at night and 2 capsules in the morning and one in the  afternoon for 4 days  Then take 2 capsules three times a day    Dispense:  180 capsule    Refill:  3   DISCONTD: pregabalin (LYRICA) 75 MG capsule    Sig: Take 2 capsules at night one in the morning and one in the afternoon for 4 days  Then take 2 capsules at night and 2 capsules in the morning and one in the afternoon for 4 days  Then take 2 capsules three times a day    Dispense:  168 capsule    Refill:  0   pregabalin (LYRICA) 75 MG capsule    Sig: Take 2 capsules at night one in the morning and one in the afternoon for 4 days Then take 2 capsules at night and 2 capsules in the morning and one in the afternoon for 4 days Then take 2 capsules three times a day    Dispense:  168 capsule    Refill:  0      Procedures: No procedures performed   Clinical Data: Findings:  CLINICAL DATA: Evaluate for  median nerve compression status post carpal tunnel release surgery performed on 02/14/2021. Complex regional pain syndrome  EXAM: MR OF THE RIGHT WRIST WITHOUT AND WITH CONTRAST  TECHNIQUE: Multiplanar multisequence MR imaging of the right wrist was performed both before and after the administration of intravenous contrast.  CONTRAST: 61m MULTIHANCE GADOBENATE DIMEGLUMINE 529 MG/ML IV SOLN  COMPARISON: X-ray 04/03/2021  FINDINGS: Technical Note: Despite efforts by the technologist and patient, motion artifact is present on today's exam and could not be eliminated. This reduces exam sensitivity and specificity.  Ligaments: Scapholunate ligament is degenerated, particularly involving the volar band. No evidence of acute injury. Lunotriquetral ligament also appears to be degenerated, evaluation is limited by motion degradation and lack of intra-articular contrast.  Triangular fibrocartilage: Degenerated appearance of the TFCC. No obvious full-thickness tear on non arthrographic imaging.  Tendons: Mild tendinosis of the extensor carpi ulnaris tendon at the level of the  ulnar styloid. Trace tenosynovial fluid and enhancement involving the second, third, and fourth extensor compartments at the level of the wrist.  Carpal tunnel/median nerve: Postsurgical changes of prior carpal tunnel release with evidence of retinacular regrowth (series 19, image 8). There is slight flattening of the median nerve at the level of the carpal tunnel. Median nerve proximal to the carpal tunnel appears mildly enlarged. No abnormal postcontrast enhancement.  Guyon's canal: Unremarkable.  Joint/cartilage: Mild degenerative changes of the wrist and first CMC joints. Trace radiocarpal joint effusion with internal heterogeneity suspecting a component of synovitis.  Bones/carpal alignment: Patchy bone marrow edema throughout the bones of the carpus without evidence of fracture. No malalignment. No suspicious bone lesion.  Other: Mild circumferential soft tissue edema. No organized fluid collection.  IMPRESSION: 1. Postsurgical changes of prior carpal tunnel release with evidence of retinacular regrowth. There is slight flattening of the median nerve at the level of the carpal tunnel. Median nerve proximal to the carpal tunnel appears mildly enlarged. Imaging findings are suggestive of recurrent carpal tunnel syndrome. Correlation with electromyography may be helpful to confirm. 2. Mild tendinosis of the extensor carpi ulnaris tendon at the level of the ulnar styloid. 3. Mild tenosynovitis involving the second, third, and fourth extensor compartments at the level of the wrist. 4. Patchy bone marrow edema throughout the bones of the carpus without evidence of fracture. Findings may be related to complex regional pain syndrome. 5. Trace radiocarpal joint effusion with internal heterogeneity suspecting a component of synovitis.   Electronically Signed By: NDavina PokeD.O. On: 04/28/2021 12:46   Subjective: Chief Complaint  Patient presents with   Right Wrist -  Follow-up    MRI Review    HPI  Review of Systems   Objective: Vital Signs: BP (!) 165/79 (BP Location: Left Arm, Patient Position: Sitting)   Pulse (!) 55   Ht '6\' 2"'$  (1.88 m)   Wt 190 lb (86.2 kg)   BMI 24.39 kg/m   Physical Exam  Ortho Exam  Specialty Comments:  No specialty comments available.  Imaging: No results found.   PMFS History: Patient Active Problem List   Diagnosis Date Noted   Carpal tunnel syndrome, right upper limb 02/14/2021    Priority: High    Class: Chronic   Carpal tunnel syndrome, bilateral 01/13/2021    Priority: High    Class: Chronic   Complex regional pain syndrome type 1 affecting hand, right 05/22/2021   Gout 01/03/2021   [place] 2 [place] 01/03/[place]   Hypertension 01/03/2021   Past Medical History:  Diagnosis Date  Cancer [facility])    prostate cancer, not treating just watching numbers   Chronic kidney disease    CKD   Diabetes mellitus without complication ([place])    no meds now   Gout    Hypertension    Memory deficit     No family history on file.  Past Surgical History:  Procedure Laterality Date   BACK SURGERY     CARPAL TUNNEL RELEASE Left 01/13/2021   Procedure: LEFT OPEN CARPAL TUNNEL RELEASE;  Surgeon: Jessy Oto, MD;  Location: [place];  Service: Orthopedics;  Laterality: Left;   CARPAL TUNNEL RELEASE Right 02/14/2021   Procedure: RIGHT OPEN CARPAL TUNNEL RELEASE;  Surgeon: Jessy Oto, MD;  Location: [place];  Service: Orthopedics;  Laterality: Right;   CARPAL TUNNEL RELEASE Right 08/06/2021   Procedure: REVISION RIGHT CARPAL TUNNEL RELEASE;  Surgeon: Sherilyn Cooter, MD;  Location: [place];  Service: Orthopedics;  Laterality: Right;   COLONOSCOPY W/ BIOPSIES AND POLYPECTOMY     KNEE SURGERY     TONSILLECTOMY     Social History   Occupational History   Not on file  Tobacco Use   Smoking status: Never   Smokeless tobacco: Never  Vaping  Use   Vaping Use: Never used  Substance and Sexual Activity   Alcohol use: Never   Drug use: Never   Sexual activity: Not on file

## 2021-05-14 NOTE — Patient Instructions (Addendum)
Home therapy for swelling and stiffness right hand post carpal tunnel release. Start use of arthritis medication like motrin, naprosyn or lodine.  Increase the lyrica to decrease the nerve pain. Appointment for second opinion with Dr. Audria Nine.

## 2021-05-16 MED ORDER — PREGABALIN 75 MG PO CAPS: ORAL_CAPSULE | ORAL | 0 refills | Status: DC

## 2021-05-16 MED ORDER — PREGABALIN 75 MG PO CAPS: ORAL_CAPSULE | ORAL | 3 refills | Status: DC

## 2021-05-16 MED ORDER — PREGABALIN 75 MG PO CAPS: ORAL_CAPSULE | ORAL | 0 refills | Status: AC

## 2021-05-19 ENCOUNTER — Ambulatory Visit: Payer: Medicare Other | Admitting: Orthopedic Surgery

## 2021-05-22 ENCOUNTER — Encounter: Payer: Self-pay | Admitting: Orthopedic Surgery

## 2021-05-22 ENCOUNTER — Ambulatory Visit (INDEPENDENT_AMBULATORY_CARE_PROVIDER_SITE_OTHER): Payer: Medicare Other | Admitting: Orthopedic Surgery

## 2021-05-22 ENCOUNTER — Other Ambulatory Visit: Payer: Self-pay

## 2021-05-22 DIAGNOSIS — G90511 Complex regional pain syndrome I of right upper limb: Secondary | ICD-10-CM | POA: Diagnosis not present

## 2021-05-22 DIAGNOSIS — G5601 Carpal tunnel syndrome, right upper limb: Secondary | ICD-10-CM

## 2021-05-22 NOTE — Progress Notes (Signed)
Office Visit Note   Patient: Timothy Clay           Date of Birth: October 08, 1935           MRN: [id number] Visit Date: 05/22/2021              Requested by: Andres Shad, MD [address] [place] [id number] PCP: Andres Shad, MD   Assessment & Plan: Visit Diagnoses:  1. Carpal tunnel syndrome, right upper limb   2. Complex regional pain syndrome type 1 affecting hand, right     Plan: After discussion with patient and his family today, it seems like his symptoms have greatly improved since his last visit.  He was previously unable to actively flex his fingers or make a fist and had to use his contralateral hand.  Today, he is able to make a complete but weak fist.  His previously described neuropathic-type pain also seems to be much improved.  He has previously done therapy and has been working hard at home with his previous exercises including working with putty and fine motor tasks.  He recently started etodolac last week with improvement of his swelling.  His daughter is picking up his Lyrica prescription this week. At this point, and after our discussion, the patient and his family want him to continue to work on function, strength, and ROM at home rather than formal therapy visits.  If his condition worsens again or he loses progress, he will follow up again.  Follow-Up Instructions: No follow-ups on file.   Orders:  No orders of the defined types were placed in this encounter.  No orders of the defined types were placed in this encounter.     Procedures: No procedures performed   Clinical Data: No additional findings.   Subjective: Chief Complaint  Patient presents with   Right Hand - Pain    This is a 85 yo M s/p R CTR on 02/14/21 who presents as a referral from Dr. Louanne Skye for likely CRPS type 1.  Timothy Clay reports that the numbness and tingling in his fingers improved greatly after surgery.  Unfortunately, he developed severe swelling of  his hand and fingers with severely limited ROM.  He also developed burning, neuropathic type pain in his hand.  He has since been on various NSAIDs, including etodolac currently.  He is taking amitriptyline.  He has been given a new prescription for Lyrica which has not been filled yet.  He has done formal therapy to work on edema control, finger ROM, and strengthening.  Over the last week or more, his symptoms have greatly improved.  He is able to make a complete fist today which he was previously unable to do.  His neuropathic pain is also much improved.  He and his family are overall feeling very encouraged today.    Review of Systems  Constitutional: Negative.   Respiratory: Negative.    Cardiovascular: Negative.   Musculoskeletal:  Positive for joint swelling.  Skin: Negative.   Neurological:  Positive for weakness.    Objective: Vital Signs: There were no vitals taken for this visit.  Physical Exam Cardiovascular:     Rate and Rhythm: Normal rate.     Pulses: Normal pulses.  Pulmonary:     Effort: Pulmonary effort is normal.  Skin:    General: Skin is warm and dry.     Capillary Refill: Capillary refill takes less than 2 seconds.  Neurological:     General:  No focal deficit present.     Mental Status: He is alert.    Right Hand Exam   Tenderness  Right hand tenderness location: No TTP today.  Muscle Strength  Wrist extension: 4/5  Wrist flexion: 4/5   Other  Erythema: absent Sensation: normal Pulse: present  Comments:  Well healed CTR incision.  Mild to moderate swelling of the hand and fingers relative to the contralateral hand.  - Tinel sign over the carpal tunnel.  Able to make complete but weak composite fist.     Specialty Comments:  No specialty comments available.  Imaging: Previous MRI from 04/26/21 reviewed by me.  The study demonstrates scattered degenerative changes involving the ulnar sided structures including the TFCC. There may be flattening of  the median nerve at the level of the transverse carpal ligament.  There is some scattered edema involving the carpal bones.   PMFS History: Patient Active Problem List   Diagnosis Date Noted   Complex regional pain syndrome type 1 affecting hand, right 05/22/2021   Carpal tunnel syndrome, right upper limb 02/14/2021    Class: Chronic   Carpal tunnel syndrome, bilateral 01/13/2021    Class: Chronic   Gout 01/03/2021   Type 2 diabetes mellitus ([place]) 01/03/2021   Hypertension 01/03/2021   Past Medical History:  Diagnosis Date   Cancer [facility])    prostate cancer, not treating just watching numbers   Chronic kidney disease    CKD   Diabetes mellitus without complication ([place])    no meds now   Gout    Hypertension    Memory deficit     No family history on file.  Past Surgical History:  Procedure Laterality Date   BACK SURGERY     CARPAL TUNNEL RELEASE Left 01/13/2021   Procedure: LEFT OPEN CARPAL TUNNEL RELEASE;  Surgeon: Jessy Oto, MD;  Location: [place];  Service: Orthopedics;  Laterality: Left;   CARPAL TUNNEL RELEASE Right 02/14/2021   Procedure: RIGHT OPEN CARPAL TUNNEL RELEASE;  Surgeon: Jessy Oto, MD;  Location: [place];  Service: Orthopedics;  Laterality: Right;   COLONOSCOPY W/ BIOPSIES AND POLYPECTOMY     KNEE SURGERY     TONSILLECTOMY     Social History   Occupational History   Not on file  Tobacco Use   Smoking status: Never   Smokeless tobacco: Never  Vaping Use   Vaping Use: Never used  Substance and Sexual Activity   Alcohol use: Never   Drug use: Never   Sexual activity: Not on file

## 2021-06-11 ENCOUNTER — Ambulatory Visit: Payer: Medicare Other | Admitting: Specialist

## 2021-06-26 ENCOUNTER — Ambulatory Visit (INDEPENDENT_AMBULATORY_CARE_PROVIDER_SITE_OTHER): Payer: Medicare Other | Admitting: Orthopedic Surgery

## 2021-06-26 ENCOUNTER — Other Ambulatory Visit: Payer: Self-pay

## 2021-06-26 ENCOUNTER — Encounter: Payer: Self-pay | Admitting: Orthopedic Surgery

## 2021-06-26 VITALS — BP 139/89 | HR 72 | Ht 74.0 in | Wt 190.0 lb

## 2021-06-26 DIAGNOSIS — G5601 Carpal tunnel syndrome, right upper limb: Secondary | ICD-10-CM

## 2021-06-26 DIAGNOSIS — G90511 Complex regional pain syndrome I of right upper limb: Secondary | ICD-10-CM

## 2021-06-26 NOTE — Progress Notes (Signed)
Office Visit Note   Patient: Timothy Clay           Date of Birth: February 17, 1936           MRN: [id number] Visit Date: 06/26/2021              Requested by: Andres Shad, MD [address] [place] [id number] PCP: Andres Shad, MD   Assessment & Plan: Visit Diagnoses:  1. Carpal tunnel syndrome, right upper limb   2. Complex regional pain syndrome type 1 affecting hand, right     Plan: Patient's swelling of his right hand seems much improved today from previous.  He still having difficulty making a fist and lacks 1 to 2 cm from his distal palmar crease with all of his fingers.  He describes continued numbness and paresthesias in the thumb index finger and middle finger that often wakes him at night.  This sounds like recurrent versus persistent carpal tunnel syndrome.  Discussed with the patient and his daughter that I like to repeat the nerve conduction study/EMG to further evaluate his symptoms.  I will see him back in the office after this is completed.  Follow-Up Instructions: No follow-ups on file.   Orders:  No orders of the defined types were placed in this encounter.  No orders of the defined types were placed in this encounter.     Procedures: No procedures performed   Clinical Data: No additional findings.   Subjective: Chief Complaint  Patient presents with   Right Hand - Follow-up    This is a 85 yo M s/p R CTR on 02/14/21 who presents for follow-up of presumed CRPS type I with continued numbness and paresthesias involving the thumb, index, and middle fingers.  He was last seen at the end of September at which time he was improving.  His swelling had improved.  He had started to etodolac which she thinks was helping.  He had been on Lyrica.  Last visit, his neuropathic type pain had improved.  Today he describes continued numbness and tingling involving the thumb, index, and middle fingers.  His swelling has not resolved.  His symptoms  wake him up nearly every night.  as a referral from Dr. Louanne Skye for likely CRPS type 1.  [name] reports that the numbness and tingling in his fingers improved greatly after surgery.  Unfortunately, he developed severe swelling of his hand and fingers with severely limited ROM.  He also developed burning, neuropathic type pain in his hand.  He has since been on various NSAIDs, including etodolac currently.  He is taking amitriptyline.  He has been given a new prescription for Lyrica which has not been filled yet.  He has done formal therapy to work on edema control, finger ROM, and strengthening.  Over the last week or more, his symptoms have greatly improved.  He is able to make a complete fist today which he was previously unable to do.  His neuropathic pain is also much improved.  He and his family are overall feeling very encouraged today.    Review of Systems   Objective: Vital Signs: BP 139/89 (BP Location: Right Arm, Patient Position: Sitting, Cuff Size: Large)   Pulse 72   Ht 6\' 2"  (1.88 m)   Wt 190 lb (86.2 kg)   SpO2 98%   BMI 24.39 kg/m   Physical Exam Constitutional:      Appearance: Normal appearance.  Cardiovascular:     Rate and  Rhythm: Normal rate.     Pulses: Normal pulses.  Pulmonary:     Effort: Pulmonary effort is normal.  Skin:    General: Skin is warm and dry.     Capillary Refill: Capillary refill takes less than 2 seconds.  Neurological:     Mental Status: He is alert.    Right Hand Exam   Other  Sensation: decreased Pulse: present  Comments:  Minimal swelling of hand compared to contralateral side.  Fingers still stiff.  He lacks 1-2 cm of flexion to the distal palmar crease.  He has a small incision proximal to the wrist flexion crease.  His provocative carpal tunnel signs were equivocal today.      Specialty Comments:  No specialty comments available.  Imaging: No results found.   PMFS History: Patient Active Problem List   Diagnosis  Date Noted   Complex regional pain syndrome type 1 affecting hand, right 05/22/2021   Carpal tunnel syndrome, right upper limb 02/14/2021    Class: Chronic   Carpal tunnel syndrome, bilateral 01/13/2021    Class: Chronic   Gout 01/03/2021   Type 2 diabetes mellitus ([place]) 01/03/2021   Hypertension 01/03/2021   Past Medical History:  Diagnosis Date   Cancer [facility])    prostate cancer, not treating just watching numbers   Chronic kidney disease    CKD   Diabetes mellitus without complication ([place])    no meds now   Gout    Hypertension    Memory deficit     History reviewed. No pertinent family history.  Past Surgical History:  Procedure Laterality Date   BACK SURGERY     CARPAL TUNNEL RELEASE Left 01/13/2021   Procedure: LEFT OPEN CARPAL TUNNEL RELEASE;  Surgeon: Jessy Oto, MD;  Location: [place];  Service: Orthopedics;  Laterality: Left;   CARPAL TUNNEL RELEASE Right 02/14/2021   Procedure: RIGHT OPEN CARPAL TUNNEL RELEASE;  Surgeon: Jessy Oto, MD;  Location: [place];  Service: Orthopedics;  Laterality: Right;   COLONOSCOPY W/ BIOPSIES AND POLYPECTOMY     KNEE SURGERY     TONSILLECTOMY     Social History   Occupational History   Not on file  Tobacco Use   Smoking status: Never   Smokeless tobacco: Never  Vaping Use   Vaping Use: Never used  Substance and Sexual Activity   Alcohol use: Never   Drug use: Never   Sexual activity: Not on file

## 2021-07-04 ENCOUNTER — Other Ambulatory Visit: Payer: Self-pay

## 2021-07-04 ENCOUNTER — Encounter: Payer: Medicare Other | Admitting: Physical Medicine and Rehabilitation

## 2021-07-04 ENCOUNTER — Encounter: Payer: Self-pay | Admitting: Physical Medicine and Rehabilitation

## 2021-07-04 ENCOUNTER — Ambulatory Visit (INDEPENDENT_AMBULATORY_CARE_PROVIDER_SITE_OTHER): Payer: Medicare Other | Admitting: Physical Medicine and Rehabilitation

## 2021-07-04 DIAGNOSIS — R202 Paresthesia of skin: Secondary | ICD-10-CM

## 2021-07-04 NOTE — Progress Notes (Signed)
Right hand numbness, pain, and swelling. First 3 fingers are worse. States that he has had pain "since they took the bandages off."

## 2021-07-09 NOTE — Progress Notes (Signed)
Timothy Clay - 85 y.o. male MRN [id number]  Date of birth: December 12, 1935  Office Visit Note: Visit Date: 07/04/2021 PCP: Andres Shad, MD Referred by: Andres Shad, *  Subjective: Chief Complaint  Patient presents with   Right Hand - Numbness, Pain   HPI:  Timothy Clay is a 85 y.o. male who comes in today at the request of Dr. Audria Nine for repeat electrodiagnostic study of the right upper limb.  Patient's full history can be reviewed in the chart.  He was being seen by Dr. Basil Dess and his assistant [name].  Patient underwent electrodiagnostic study of both upper limbs by me in May of this year.  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  It actually showed left worse than right in terms of nerve conduction.  Needle EMG was performed on the left only.  The right did show decent motor stimulation and recording over the APB.  There was significant reduced amplitude and clearly severe median neuropathy on the right as well as the left.  Based on those findings we did report that there would likely be residual symptoms even with carpal tunnel release.  Patient's medical history is complicated by type 2 diabetes.  Subsequently patient underwent carpal tunnel release on the left in May of this year and then carpal tunnel release on the right in June of this year.  He and his family report worsening symptoms since the carpal tunnel release on the right.  I actually saw the patient in July at Dr. Otho Ket request for stellate ganglion block for presumed complex regional pain syndrome on the right.  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  Stellate ganglion block did not offer much benefit.  Patient has not had consultation with Dr. Tempie Donning and here for repeat study of the right upper extremity.  He reports pain numbness and swelling and weakness in the right hand particularly in the first 3 digits.  He  states that he has had worsening symptoms since the surgery.  He reports that his left hand does feel better although he is significantly weaker than the left hand.  He does have significant atrophy of both APB musculature and some intrinsic atrophy.  ROS Otherwise per HPI.  Assessment & Plan: Visit Diagnoses:    ICD-10-CM   1. 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  There is once again demonstrated demyelinating injury and severe axonal injury.  Comparing this study to the study prior to the carpal tunnel release this does represent worsening of the neuropathy and nerve damage.  At this point the patient does not meet any of the [place] criteria for complex regional pain syndrome.  There is mild evidence of underlying polyneuropathy.  If further study for polyneuropathy needed I would suggest referral to neurology.  Recommendations:  Follow-up with referring physician.  Meds & Orders: No orders of the defined types were placed in this encounter.   Orders Placed This Encounter  Procedures   NCV with EMG (electromyography)    Follow-up: Return in about 2 weeks (around 07/18/2021) for Audria Nine, MD.   Procedures: No procedures performed  EMG & NCV Findings: Evaluation of the right median motor nerve showed prolonged distal onset latency (9.0 ms), reduced  amplitude (0.0 mV), and decreased conduction velocity (Elbow-Wrist, 23 m/s).  The right median (across palm) sensory nerve showed no response (Wrist) and no response (Palm).  The right ulnar sensory nerve showed prolonged distal peak latency (4.3 ms), reduced amplitude (9.8 V), and decreased conduction velocity (Wrist-5th Digit, 33 m/s).    Needle evaluation of the right abductor pollicis brevis muscle showed decreased insertional activity, widespread  spontaneous activity, decreased motor unit amplitude, and diminished recruitment.  All remaining muscles (as indicated in the following table) showed no evidence of electrical instability.    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  There is once again demonstrated demyelinating injury and severe axonal injury.  Comparing this study to the study prior to the carpal tunnel release this does represent worsening of the neuropathy and nerve damage.  At this point the patient does not meet any of the [place] criteria for complex regional pain syndrome.  There is mild evidence of underlying polyneuropathy.  If further study for polyneuropathy needed I would suggest referral to neurology.  Recommendations:  Follow-up with referring physician.  ___________________________ [facility]rence Spates [facility] Board Certified, American Board of Physical Medicine and Rehabilitation    Nerve Conduction Studies Anti Sensory Summary Table   Stim Site NR Peak (ms) Norm Peak (ms) P-T Amp (V) Norm P-T Amp Site1 Site2 Delta-P (ms) Dist (cm) Vel (m/s) Norm Vel (m/s)  Right Median Acr Palm Anti Sensory (2nd Digit)  29.6C  Wrist *NR  <3.6  >10 Wrist Palm  0.0    Palm *NR  <2.0          Right Ulnar Anti Sensory (5th Digit)  29.9C  Wrist    *4.3 <3.7 *9.8 >15.0 Wrist 5th Digit 4.3 14.0 *33 >38   Motor Summary Table   Stim Site NR Onset (ms) Norm Onset (ms) O-P Amp (mV) Norm O-P Amp Site1 Site2 Delta-0 (ms) Dist (cm) Vel (m/s) Norm Vel (m/s)  Right Median Motor (Abd Poll Brev)  30C  Wrist    *9.0 <4.2 *0.0 >5 Elbow Wrist 11.9 27.0 *23 >50  Elbow    20.9  0.1          EMG   Side Muscle Nerve Root Ins Act Fibs Psw Amp Dur Poly Recrt Int Fraser Din Comment  Right Abd Poll Brev Median C8-T1 *Decr *4+ *4+ *Decr Nml 0 *Reduced Nml few muaps  Right 1stDorInt Ulnar C8-T1 Nml Nml Nml Nml Nml 0 Nml Nml   Right PronatorTeres Median  C6-7 Nml Nml Nml Nml Nml 0 Nml Nml   Right Biceps Musculocut C5-6 Nml Nml Nml Nml Nml 0 Nml Nml     Nerve Conduction Studies Anti Sensory Left/Right Comparison   Stim Site L Lat (ms) R Lat (ms) L-R Lat (ms) L Amp (V) R Amp (V) L-R Amp (%) Site1 Site2 L Vel (m/s) R Vel (m/s) L-R Vel (m/s)  Median Acr Palm Anti Sensory (2nd Digit)  29.6C  Wrist       Wrist Palm     Palm             Ulnar Anti Sensory (5th Digit)  29.9C  Wrist  *4.3   *9.8  Wrist 5th Digit  *33    Motor Left/Right Comparison   Stim Site L Lat (ms) R Lat (ms) L-R Lat (ms) L Amp (mV) R Amp (mV) L-R Amp (%) Site1 Site2 L Vel (m/s) R Vel (  m/s) L-R Vel (m/s)  Median Motor (Abd Poll Brev)  30C  Wrist  *9.0   *0.0  Elbow Wrist  *23   Elbow  20.9   0.1           Waveforms:        Clinical History: 01/02/2021 Electrodiagnostic study of both upper limbs Impression: The above electrodiagnostic study is ABNORMAL and reveals evidence of a severe BLATERAL left worse than right median nerve entrapment at the wrist (carpal tunnel syndrome) affecting sensory and motor components. The lesion is characterized by sensory and motor demyelination with evidence of significant axonal injury.  Despite appropriate decompression treatment there is likely going to be residual symptoms.     There is no significant electrodiagnostic evidence of any other focal nerve entrapment, brachial plexopathy or cervical radiculopathy.    Recommendations: 1.  Follow-up with referring physician. 2.  Continue current management of symptoms. 3.  Suggest surgical evaluation.   ___________________________ Wonda Olds Board Certified, American Board of Physical Medicine and Rehabilitation     Objective:  VS:  HT:    WT:   BMI:     BP:   HR: bpm  TEMP: ( )  RESP:  Physical Exam Musculoskeletal:        General: Swelling present. No tenderness.     Comments: Inspection reveals significant atrophy of the bilateral APB and some intrinsic  wasting.  There is well-healed carpal tunnel release surgical scars on both hands.  There is no swelling, color changes, allodynia or dystrophic changes. There is 5 out of 5 strength in the bilateral wrist extension, finger abduction and long finger flexion.  There is decreased sensation to light touch in a median nerve distribution bilaterally.    Skin:    General: Skin is warm and dry.     Findings: No erythema or rash.  Neurological:     General: No focal deficit present.     Mental Status: He is alert and oriented to person, place, and time.     Sensory: Sensory deficit present.     Motor: Weakness present. No abnormal muscle tone.     Coordination: Coordination normal.     Gait: Gait normal.  Psychiatric:        Mood and Affect: Mood normal.        Behavior: Behavior normal.        Thought Content: Thought content normal.     Imaging: No results found.

## 2021-07-09 NOTE — Procedures (Signed)
EMG & NCV Findings: Evaluation of the right median motor nerve showed prolonged distal onset latency (9.0 ms), reduced amplitude (0.0 mV), and decreased conduction velocity (Elbow-Wrist, 23 m/s).  The right median (across palm) sensory nerve showed no response (Wrist) and no response (Palm).  The right ulnar sensory nerve showed prolonged distal peak latency (4.3 ms), reduced amplitude (9.8 V), and decreased conduction velocity (Wrist-5th Digit, 33 m/s).    Needle evaluation of the right abductor pollicis brevis muscle showed decreased insertional activity, widespread spontaneous activity, decreased motor unit amplitude, and diminished recruitment.  All remaining muscles (as indicated in the following table) showed no evidence of electrical instability.    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  There is once again demonstrated demyelinating injury and severe axonal injury.  Comparing this study to the study prior to the carpal tunnel release this does represent worsening of the neuropathy and nerve damage.  At this point the patient does not meet any of the [place] criteria for complex regional pain syndrome.  There is mild evidence of underlying polyneuropathy.  If further study for polyneuropathy needed I would suggest referral to neurology.  Recommendations:  Follow-up with referring physician.  ___________________________ [facility]rence Spates [facility] Board Certified, American Board of Physical Medicine and Rehabilitation    Nerve Conduction Studies Anti Sensory Summary Table   Stim Site NR Peak (ms) Norm Peak (ms) P-T Amp (V) Norm P-T Amp Site1 Site2 Delta-P (ms) Dist (cm) Vel (m/s) Norm Vel (m/s)  Right Median Acr Palm Anti Sensory (2nd Digit)  29.6C  Wrist *NR  <3.6  >10 Wrist Palm  0.0    Palm *NR  <2.0          Right Ulnar Anti Sensory (5th Digit)  29.9C  Wrist    *4.3 <3.7 *9.8 >15.0  Wrist 5th Digit 4.3 14.0 *33 >38   Motor Summary Table   Stim Site NR Onset (ms) Norm Onset (ms) O-P Amp (mV) Norm O-P Amp Site1 Site2 Delta-0 (ms) Dist (cm) Vel (m/s) Norm Vel (m/s)  Right Median Motor (Abd Poll Brev)  30C  Wrist    *9.0 <4.2 *0.0 >5 Elbow Wrist 11.9 27.0 *23 >50  Elbow    20.9  0.1          EMG   Side Muscle Nerve Root Ins Act Fibs Psw Amp Dur Poly Recrt Int Fraser Din Comment  Right Abd Poll Brev Median C8-T1 *Decr *4+ *4+ *Decr Nml 0 *Reduced Nml few muaps  Right 1stDorInt Ulnar C8-T1 Nml Nml Nml Nml Nml 0 Nml Nml   Right PronatorTeres Median C6-7 Nml Nml Nml Nml Nml 0 Nml Nml   Right Biceps Musculocut C5-6 Nml Nml Nml Nml Nml 0 Nml Nml     Nerve Conduction Studies Anti Sensory Left/Right Comparison   Stim Site L Lat (ms) R Lat (ms) L-R Lat (ms) L Amp (V) R Amp (V) L-R Amp (%) Site1 Site2 L Vel (m/s) R Vel (m/s) L-R Vel (m/s)  Median Acr Palm Anti Sensory (2nd Digit)  29.6C  Wrist       Wrist Palm     Palm             Ulnar Anti Sensory (5th Digit)  29.9C  Wrist  *4.3   *9.8  Wrist 5th Digit  *33    Motor Left/Right Comparison   Stim Site L Lat (ms) R Lat (ms) L-R  Lat (ms) L Amp (mV) R Amp (mV) L-R Amp (%) Site1 Site2 L Vel (m/s) R Vel (m/s) L-R Vel (m/s)  Median Motor (Abd Poll Brev)  30C  Wrist  *9.0   *0.0  Elbow Wrist  *23   Elbow  20.9   0.1           Waveforms:

## 2021-07-18 ENCOUNTER — Other Ambulatory Visit: Payer: Self-pay

## 2021-07-18 ENCOUNTER — Ambulatory Visit (INDEPENDENT_AMBULATORY_CARE_PROVIDER_SITE_OTHER): Payer: Medicare Other | Admitting: Orthopedic Surgery

## 2021-07-18 DIAGNOSIS — G5601 Carpal tunnel syndrome, right upper limb: Secondary | ICD-10-CM

## 2021-07-18 NOTE — Progress Notes (Signed)
Office Visit Note   Patient: Timothy Clay           Date of Birth: Dec 13, 1935           MRN: [id number] Visit Date: 07/18/2021              Requested by: Andres Shad, MD [address] [place] [address] PCP: Andres Shad, MD   Assessment & Plan: Visit Diagnoses:  1. Carpal tunnel syndrome, right upper limb     Plan: Discussed with patient that his symptoms and electrodiagnostic studies suggest persistent versus recurrent right carpal tunnel syndrome.  It doesn't sound like he ever had consistent symptom relief after the first surgery.  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  He does not meet the criteria for CRPS.  We discussed treatment options including revision CTR and the associated risks, including persistent symptoms, and benefits.  He wants to proceed with revision CTR.   Follow-Up Instructions: No follow-ups on file.   Orders:  No orders of the defined types were placed in this encounter.  No orders of the defined types were placed in this encounter.     Procedures: No procedures performed   Clinical Data: No additional findings.   Subjective: Chief Complaint  Patient presents with   Right Hand - Follow-up    EMG/NCS Review, index and middle finger still swollen, still gets sharp pains thru the hand    This is a 85 yo M s/p R CTR on 02/14/21 who presents for follow-up of continued numbness and paresthesias involving the thumb, index, and middle fingers.  He had a complicated postop course involving severe swelling and stiffness.  His symptoms never resolved.  He was put on lyrica and etodolac.  He underwent stellate ganglion block for presumed CRPS with no symptom relief.  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  He still describes numbness, paresthesias, and shooting pain into the thumb, index, and middle fingers.  This wakes him up at night. He denies any issues with the ring  and small fingers.      Review of Systems   Objective: Vital Signs: There were no vitals taken for this visit.  Physical Exam Constitutional:      Appearance: Normal appearance.  Cardiovascular:     Rate and Rhythm: Normal rate.     Pulses: Normal pulses.  Pulmonary:     Effort: Pulmonary effort is normal.  Skin:    General: Skin is warm and dry.     Capillary Refill: Capillary refill takes less than 2 seconds.  Neurological:     Mental Status: He is alert.    Right Hand Exam   Tenderness  The patient is experiencing no tenderness.   Other  Erythema: absent Sensation: decreased Pulse: present  Comments:  Equivocal provocative tests with vague symptoms in hand and fingers with Tinel, CT compression, and Phalen's test.  Diminished sensation in thumb, index, and middle fingers compared to c/l hand.  4/5 thenar motor strength.  Sensation in ring and small fingers symmetric to contralateral side.      Specialty Comments:  No specialty comments available.  Imaging: No results found.   PMFS History: Patient Active Problem List   Diagnosis Date Noted   Complex regional pain syndrome type 1 affecting hand, right 05/22/2021   Carpal tunnel syndrome, right upper limb 02/14/2021    Class: Chronic   Carpal tunnel syndrome, bilateral 01/13/2021  Class: Chronic   Gout 01/03/2021   Type 2 diabetes mellitus ([place]) 01/03/2021   Hypertension 01/03/2021   Past Medical History:  Diagnosis Date   Cancer [facility])    prostate cancer, not treating just watching numbers   Chronic kidney disease    CKD   Diabetes mellitus without complication ([place])    no meds now   Gout    Hypertension    Memory deficit     No family history on file.  Past Surgical History:  Procedure Laterality Date   BACK SURGERY     CARPAL TUNNEL RELEASE Left 01/13/2021   Procedure: LEFT OPEN CARPAL TUNNEL RELEASE;  Surgeon: Jessy Oto, MD;  Location: [facility];  Service:  Orthopedics;  Laterality: Left;   CARPAL TUNNEL RELEASE Right 02/14/2021   Procedure: RIGHT OPEN CARPAL TUNNEL RELEASE;  Surgeon: Jessy Oto, MD;  Location: [facility];  Service: Orthopedics;  Laterality: Right;   COLONOSCOPY W/ BIOPSIES AND POLYPECTOMY     KNEE SURGERY     TONSILLECTOMY     Social History   Occupational History   Not on file  Tobacco Use   Smoking status: Never   Smokeless tobacco: Never  Vaping Use   Vaping Use: Never used  Substance and Sexual Activity   Alcohol use: Never   Drug use: Never   Sexual activity: Not on file

## 2021-07-28 ENCOUNTER — Telehealth: Payer: Self-pay | Admitting: Orthopedic Surgery

## 2021-07-28 NOTE — Telephone Encounter (Signed)
Pt's daughter calling to see when Dr. Madelynn Done next surg opening would be for her father. He was seen 07/18/21 and was told that he would be needing surg on his right hand. The best phone number to call back is [phone].

## 2021-08-01 ENCOUNTER — Encounter (HOSPITAL_BASED_OUTPATIENT_CLINIC_OR_DEPARTMENT_OTHER): Payer: Self-pay | Admitting: Orthopedic Surgery

## 2021-08-01 ENCOUNTER — Other Ambulatory Visit: Payer: Self-pay

## 2021-08-05 ENCOUNTER — Encounter (HOSPITAL_BASED_OUTPATIENT_CLINIC_OR_DEPARTMENT_OTHER)
Admission: RE | Admit: 2021-08-05 | Discharge: 2021-08-05 | Disposition: A | Discharge status: Home / Self Care | Payer: Medicare Other | Source: Ambulatory Visit | Attending: Orthopedic Surgery | Admitting: Orthopedic Surgery

## 2021-08-05 DIAGNOSIS — E119 Type 2 diabetes mellitus without complications: Secondary | ICD-10-CM | POA: Diagnosis not present

## 2021-08-05 DIAGNOSIS — G5601 Carpal tunnel syndrome, right upper limb: Secondary | ICD-10-CM | POA: Diagnosis present

## 2021-08-05 LAB — BASIC METABOLIC PANEL
Anion gap: 8 (ref 5–15)
BUN: 12 mg/dL (ref 8–23)
CO2: 23 mmol/L (ref 22–32)
Calcium: 9.2 mg/dL (ref 8.9–10.3)
Chloride: 108 mmol/L (ref 98–111)
Creatinine, Ser: 1.1 mg/dL (ref 0.61–1.24)
GFR, Estimated: >60 mL/min (ref >60)
Glucose, Bld: 88 mg/dL (ref 70–99)
Potassium: 3.9 mmol/L (ref 3.5–5.1)
Sodium: 139 mmol/L (ref 135–145)

## 2021-08-06 ENCOUNTER — Ambulatory Visit (HOSPITAL_BASED_OUTPATIENT_CLINIC_OR_DEPARTMENT_OTHER): Payer: Medicare Other | Admitting: Anesthesiology

## 2021-08-06 ENCOUNTER — Encounter (HOSPITAL_BASED_OUTPATIENT_CLINIC_OR_DEPARTMENT_OTHER): Payer: Self-pay | Admitting: Orthopedic Surgery

## 2021-08-06 ENCOUNTER — Other Ambulatory Visit: Payer: Self-pay

## 2021-08-06 ENCOUNTER — Encounter (HOSPITAL_BASED_OUTPATIENT_CLINIC_OR_DEPARTMENT_OTHER)
Admission: RE | Disposition: A | Discharge status: Home / Self Care | Payer: Self-pay | Source: Home / Self Care | Attending: Orthopedic Surgery

## 2021-08-06 ENCOUNTER — Ambulatory Visit (HOSPITAL_BASED_OUTPATIENT_CLINIC_OR_DEPARTMENT_OTHER)
Admission: RE | Admit: 2021-08-06 | Discharge: 2021-08-06 | Disposition: A | Discharge status: Home / Self Care | Payer: Medicare Other | Attending: Orthopedic Surgery | Admitting: Orthopedic Surgery

## 2021-08-06 DIAGNOSIS — N189 Chronic kidney disease, unspecified: Secondary | ICD-10-CM

## 2021-08-06 DIAGNOSIS — E119 Type 2 diabetes mellitus without complications: Secondary | ICD-10-CM | POA: Insufficient documentation

## 2021-08-06 DIAGNOSIS — G5601 Carpal tunnel syndrome, right upper limb: Secondary | ICD-10-CM | POA: Insufficient documentation

## 2021-08-06 HISTORY — PX: CARPAL TUNNEL RELEASE: SHX101

## 2021-08-06 LAB — GLUCOSE, CAPILLARY: Glucose-Capillary: 92 mg/dL (ref 70–99)

## 2021-08-06 SURGERY — CARPAL TUNNEL RELEASE
Anesthesia: General | Site: Wrist | Laterality: Right

## 2021-08-06 MED ORDER — PHENYLEPHRINE 40 MCG/ML (10ML) SYRINGE FOR IV PUSH (FOR BLOOD PRESSURE SUPPORT)
PREFILLED_SYRINGE | INTRAVENOUS | Status: AC
  Filled 2021-08-06: qty 10

## 2021-08-06 MED ORDER — EPHEDRINE 5 MG/ML INJ
INTRAVENOUS | Status: AC
  Filled 2021-08-06: qty 5

## 2021-08-06 MED ORDER — FENTANYL CITRATE (PF) 100 MCG/2ML IJ SOLN: 25.0000 ug | INTRAMUSCULAR | Status: DC | PRN

## 2021-08-06 MED ORDER — FENTANYL CITRATE (PF) 100 MCG/2ML IJ SOLN
INTRAMUSCULAR | Status: DC | PRN
  Administered 2021-08-06: 25 ug via INTRAVENOUS
  Administered 2021-08-06: 50 ug via INTRAVENOUS

## 2021-08-06 MED ORDER — 0.9 % SODIUM CHLORIDE (POUR BTL) OPTIME
TOPICAL | Status: DC | PRN
  Administered 2021-08-06: 100 mL

## 2021-08-06 MED ORDER — ACETAMINOPHEN 500 MG PO TABS
ORAL_TABLET | ORAL | Status: AC
  Filled 2021-08-06: qty 2

## 2021-08-06 MED ORDER — ONDANSETRON HCL 4 MG/2ML IJ SOLN
INTRAMUSCULAR | Status: AC
  Filled 2021-08-06: qty 2

## 2021-08-06 MED ORDER — DEXAMETHASONE SODIUM PHOSPHATE 10 MG/ML IJ SOLN
INTRAMUSCULAR | Status: DC | PRN
  Administered 2021-08-06: 5 mg via INTRAVENOUS

## 2021-08-06 MED ORDER — ATROPINE SULFATE 0.4 MG/ML IV SOLN
INTRAVENOUS | Status: AC
  Filled 2021-08-06: qty 1

## 2021-08-06 MED ORDER — ACETAMINOPHEN 500 MG PO TABS
1000.0000 mg | ORAL_TABLET | Freq: Once | ORAL | Status: AC
  Administered 2021-08-06: 1000 mg via ORAL

## 2021-08-06 MED ORDER — LIDOCAINE HCL (CARDIAC) PF 100 MG/5ML IV SOSY
PREFILLED_SYRINGE | INTRAVENOUS | Status: DC | PRN
  Administered 2021-08-06: 60 mg via INTRAVENOUS

## 2021-08-06 MED ORDER — SUCCINYLCHOLINE CHLORIDE 200 MG/10ML IV SOSY
PREFILLED_SYRINGE | INTRAVENOUS | Status: AC
  Filled 2021-08-06: qty 10

## 2021-08-06 MED ORDER — OXYCODONE HCL 5 MG/5ML PO SOLN: 5.0000 mg | Freq: Once | ORAL | Status: DC | PRN

## 2021-08-06 MED ORDER — CEFAZOLIN SODIUM-DEXTROSE 2-4 GM/100ML-% IV SOLN
2.0000 g | INTRAVENOUS | Status: AC
  Administered 2021-08-06: 2 g via INTRAVENOUS

## 2021-08-06 MED ORDER — FENTANYL CITRATE (PF) 100 MCG/2ML IJ SOLN
INTRAMUSCULAR | Status: AC
  Filled 2021-08-06: qty 2

## 2021-08-06 MED ORDER — PROPOFOL 10 MG/ML IV BOLUS
INTRAVENOUS | Status: DC | PRN
  Administered 2021-08-06: 150 mg via INTRAVENOUS

## 2021-08-06 MED ORDER — CEFAZOLIN SODIUM-DEXTROSE 2-4 GM/100ML-% IV SOLN
INTRAVENOUS | Status: AC
  Filled 2021-08-06: qty 100

## 2021-08-06 MED ORDER — PROPOFOL 500 MG/50ML IV EMUL
INTRAVENOUS | Status: AC
  Filled 2021-08-06: qty 50

## 2021-08-06 MED ORDER — OXYCODONE HCL 5 MG PO TABS: 5.0000 mg | ORAL_TABLET | Freq: Once | ORAL | Status: DC | PRN

## 2021-08-06 MED ORDER — LACTATED RINGERS IV SOLN: INTRAVENOUS | Status: DC

## 2021-08-06 MED ORDER — ONDANSETRON HCL 4 MG/2ML IJ SOLN: 4.0000 mg | Freq: Once | INTRAMUSCULAR | Status: DC | PRN

## 2021-08-06 MED ORDER — LIDOCAINE 2% (20 MG/ML) 5 ML SYRINGE
INTRAMUSCULAR | Status: AC
  Filled 2021-08-06: qty 5

## 2021-08-06 MED ORDER — EPHEDRINE SULFATE 50 MG/ML IJ SOLN
INTRAMUSCULAR | Status: DC | PRN
  Administered 2021-08-06: 10 mg via INTRAVENOUS

## 2021-08-06 MED ORDER — OXYCODONE HCL 5 MG PO TABS: 5.0000 mg | ORAL_TABLET | Freq: Four times a day (QID) | ORAL | 0 refills | Status: AC | PRN

## 2021-08-06 MED ORDER — PHENYLEPHRINE HCL (PRESSORS) 10 MG/ML IV SOLN
INTRAVENOUS | Status: AC
  Filled 2021-08-06: qty 1

## 2021-08-06 MED ORDER — ONDANSETRON HCL 4 MG/2ML IJ SOLN
INTRAMUSCULAR | Status: DC | PRN
  Administered 2021-08-06: 4 mg via INTRAVENOUS

## 2021-08-06 SURGICAL SUPPLY — 41 items
APL PRP STRL LF DISP 70% ISPRP (MISCELLANEOUS) ×1
BLADE SURG 15 STRL LF DISP TIS (BLADE) ×1 IMPLANT
BLADE SURG 15 STRL SS (BLADE) ×2
BNDG CMPR 9X4 STRL LF SNTH (GAUZE/BANDAGES/DRESSINGS) ×1
BNDG ELASTIC 3X5.8 VLCR STR LF (GAUZE/BANDAGES/DRESSINGS) ×2 IMPLANT
BNDG ESMARK 4X9 LF (GAUZE/BANDAGES/DRESSINGS) ×2 IMPLANT
BNDG GAUZE ELAST 4 BULKY (GAUZE/BANDAGES/DRESSINGS) ×2 IMPLANT
BNDG PLASTER X FAST 3X3 WHT LF (CAST SUPPLIES) IMPLANT
BNDG PLSTR 9X3 FST ST WHT (CAST SUPPLIES)
CHLORAPREP W/TINT 26 (MISCELLANEOUS) ×2 IMPLANT
CORD BIPOLAR FORCEPS 12FT (ELECTRODE) ×2 IMPLANT
COVER BACK TABLE 60X90IN (DRAPES) ×2 IMPLANT
COVER MAYO STAND STRL (DRAPES) ×2 IMPLANT
CUFF TOURN SGL QUICK 18X4 (TOURNIQUET CUFF) IMPLANT
CUFF TOURN SGL QUICK 24 (TOURNIQUET CUFF)
CUFF TRNQT CYL 24X4X16.5-23 (TOURNIQUET CUFF) IMPLANT
DRAPE EXTREMITY T 121X128X90 (DISPOSABLE) ×2 IMPLANT
DRAPE U-SHAPE 47X51 STRL (DRAPES) ×2 IMPLANT
GAUZE SPONGE 4X4 12PLY STRL (GAUZE/BANDAGES/DRESSINGS) ×2 IMPLANT
GAUZE XEROFORM 1X8 LF (GAUZE/BANDAGES/DRESSINGS) ×2 IMPLANT
GLOVE SURG ENC MOIS LTX SZ7 (GLOVE) ×2 IMPLANT
GLOVE SURG UNDER POLY LF SZ7 (GLOVE) ×2 IMPLANT
GOWN STRL REUS W/ TWL LRG LVL3 (GOWN DISPOSABLE) ×1 IMPLANT
GOWN STRL REUS W/TWL LRG LVL3 (GOWN DISPOSABLE) ×2
GOWN STRL REUS W/TWL XL LVL3 (GOWN DISPOSABLE) ×2 IMPLANT
NEEDLE HYPO 25X1 1.5 SAFETY (NEEDLE) ×2 IMPLANT
NS IRRIG 1000ML POUR BTL (IV SOLUTION) ×2 IMPLANT
PACK BASIN DAY SURGERY FS (CUSTOM PROCEDURE TRAY) ×2 IMPLANT
PAD CAST 3X4 CTTN HI CHSV (CAST SUPPLIES) ×1 IMPLANT
PADDING CAST COTTON 3X4 STRL (CAST SUPPLIES) ×2
SLEEVE SCD COMPRESS KNEE MED (STOCKING) IMPLANT
SUCTION FRAZIER HANDLE 10FR (MISCELLANEOUS)
SUCTION TUBE FRAZIER 10FR DISP (MISCELLANEOUS) IMPLANT
SUT ETHILON 4 0 PS 2 18 (SUTURE) ×2 IMPLANT
SUT MNCRL AB 3-0 PS2 18 (SUTURE) IMPLANT
SUT VICRYL 4-0 PS2 18IN ABS (SUTURE) IMPLANT
SYR BULB EAR ULCER 3OZ GRN STR (SYRINGE) ×2 IMPLANT
SYR CONTROL 10ML LL (SYRINGE) IMPLANT
TOWEL GREEN STERILE FF (TOWEL DISPOSABLE) ×4 IMPLANT
TUBE CONNECTING 20X1/4 (TUBING) IMPLANT
UNDERPAD 30X36 HEAVY ABSORB (UNDERPADS AND DIAPERS) ×2 IMPLANT

## 2021-08-06 NOTE — Anesthesia Postprocedure Evaluation (Signed)
Anesthesia Post Note  Patient: Timothy Clay  Procedure(s) Performed: REVISION RIGHT CARPAL TUNNEL RELEASE (Right: Wrist)     Patient location during evaluation: PACU Anesthesia Type: General Level of consciousness: awake and alert and oriented Pain management: pain level controlled Vital Signs Assessment: post-procedure vital signs reviewed and stable Respiratory status: spontaneous breathing, nonlabored ventilation and respiratory function stable Cardiovascular status: blood pressure returned to baseline and stable Postop Assessment: no apparent nausea or vomiting Anesthetic complications: no   No notable events documented.  Last Vitals:  Vitals:   08/06/21 1115 08/06/21 1130  BP: 139/77 (!) 156/80  Pulse: (!) 58 (!) 54  Resp: (!) 9 18  Temp:  36.5 C  SpO2: 98% 98%    Last Pain:  Vitals:   08/06/21 1130  TempSrc:   PainSc: 0-No pain                 Wilhelmena Zea A.

## 2021-08-06 NOTE — Anesthesia Procedure Notes (Signed)
Procedure Name: LMA Insertion Date/Time: 08/06/2021 9:41 AM Performed by: Willa Frater, CRNA Pre-anesthesia Checklist: Patient identified, Emergency Drugs available, Suction available and Patient being monitored Patient Re-evaluated:Patient Re-evaluated prior to induction Oxygen Delivery Method: Circle system utilized Preoxygenation: Pre-oxygenation with 100% oxygen Induction Type: IV induction Ventilation: Mask ventilation without difficulty LMA: LMA inserted LMA Size: 5.0 Number of attempts: 1 Airway Equipment and Method: Bite block Placement Confirmation: positive ETCO2 Tube secured with: Tape Dental Injury: Teeth and Oropharynx as per pre-operative assessment

## 2021-08-06 NOTE — Discharge Instructions (Addendum)
Post Anesthesia Home Care Instructions  Activity: Get plenty of rest for the remainder of the day. A responsible individual must stay with you for 24 hours following the procedure.  For the next 24 hours, DO NOT: -Drive a car -Paediatric nurse -Drink alcoholic beverages -Take any medication unless instructed by your physician -Make any legal decisions or sign important papers.  Meals: Start with liquid foods such as gelatin or soup. Progress to regular foods as tolerated. Avoid greasy, spicy, heavy foods. If nausea and/or vomiting occur, drink only clear liquids until the nausea and/or vomiting subsides. Call your physician if vomiting continues.  Special Instructions/Symptoms: Your throat may feel dry or sore from the anesthesia or the breathing tube placed in your throat during surgery. If this causes discomfort, gargle with warm salt water. The discomfort should disappear within 24 hours.  If you had a scopolamine patch placed behind your ear for the management of post- operative nausea and/or vomiting:  1. The medication in the patch is effective for 72 hours, after which it should be removed.  Wrap patch in a tissue and discard in the trash. Wash hands thoroughly with soap and water. 2. You may remove the patch earlier than 72 hours if you experience unpleasant side effects which may include dry mouth, dizziness or visual disturbances. 3. Avoid touching the patch. Wash your hands with soap and water after contact with the patch.         Timothy Clay, M.D. Hand Surgery  POST-OPERATIVE DISCHARGE INSTRUCTIONS   PRESCRIPTIONS: You have been given a prescription to be taken as directed for post-operative pain control.  You may also take over the counter ibuprofen/aleve and tylenol for pain. Take this as directed on the packaging. Do not exceed 3000 mg tylenol/acetaminophen in 24 hours.  Ibuprofen 600-800 mg (3-4) tablets by mouth every 6 hours as needed for pain.   OR Aleve 2 tablets by mouth every 12 hours (twice daily) as needed for pain.  AND/OR Tylenol 1000 mg (2 tablets) every 8 hours as needed for pain.  Please use your pain medication carefully, as refills are limited and you may not be provided with one.  As stated above, please use over the counter pain medicine - it will also be helpful with decreasing your swelling.    ANESTHESIA: After your surgery, post-surgical discomfort or pain is likely. This discomfort can last several days to a few weeks. At certain times of the day your discomfort may be more intense.   Did you receive a nerve block?  A nerve block can provide pain relief for one hour to two days after your surgery. As long as the nerve block is working, you will experience little or no sensation in the area the surgeon operated on.  As the nerve block wears off, you will begin to experience pain or discomfort. It is very important that you begin taking your prescribed pain medication before the nerve block fully wears off. Treating your pain at the first sign of the block wearing off will ensure your pain is better controlled and more tolerable when full-sensation returns. Do not wait until the pain is intolerable, as the medicine will be less effective. It is better to treat pain in advance than to try and catch up.   General Anesthesia:  If you did not receive a nerve block during your surgery, you will need to start taking your pain medication shortly after your surgery and should continue to do so as prescribed by  your surgeon.     ICE AND ELEVATION: You may use ice for the first 48-72 hours, but it is not critical.   Motion of your fingers is very important s to decrease the swelling. Elevation, as much as possible for the next 48 hours, is critical for decreasing swelling as well as for pain relief. Elevation means when you are seated or lying down, you hand should be at or above your heart. When walking, the hand needs to be  at or above the level of your elbow.  If the bandage gets too tight, it may need to be loosened. Please contact our office and we will instruct you in how to do this.    SURGICAL BANDAGES:  Keep your dressing and/or splint clean and dry at all times.  You can remove your dressing 7 days from now and change with a dry dressing or Band-Aids as needed thereafter. You may place a plastic bag over your bandage to shower, but be careful, do not get your bandages wet.  After the bandages have been removed, it is OK to get the stitches wet in a shower or with hand washing. Do Not soak or submerge the wound yet. Please do not use lotions or creams on the stitches.      HAND THERAPY:  You may not need any. If you do, we will begin this at your follow up visit in the clinic.    ACTIVITY AND WORK: You are encouraged to move any fingers which are not in the bandage.  Light use of the fingers is allowed to assist the other hand with daily hygiene and eating, but strong gripping or lifting is often uncomfortable and should be avoided.  You might miss a variable period of time from work and hopefully this issue has been discussed prior to surgery. You may not do any heavy work with your affected hand for about 2 weeks.    [facility] [address] [place],  [place]  [phone]

## 2021-08-06 NOTE — Anesthesia Preprocedure Evaluation (Addendum)
Anesthesia Evaluation  Patient identified by MRN, date of birth, ID band Patient awake    Reviewed: Allergy & Precautions, NPO status , Patient's Chart, lab work & pertinent test results, reviewed documented beta blocker date and time   History of Anesthesia Complications Negative for: history of anesthetic complications  Airway Mallampati: III  TM Distance: >3 FB Neck ROM: Full    Dental  (+) Dental Advisory Given, Partial Upper, Partial Lower   Pulmonary neg pulmonary ROS,    Pulmonary exam normal        Cardiovascular hypertension, Pt. on medications and Pt. on home beta blockers Normal cardiovascular exam     Neuro/Psych  Memory deficit   Neuromuscular disease (CTS, CRPS-1) negative psych ROS   GI/Hepatic negative GI ROS, Neg liver ROS,   Endo/Other  diabetes, Well Controlled, Type 2  Renal/GU CRFRenal disease     Musculoskeletal  Gout   Abdominal   Peds  Hematology negative hematology ROS (+)   Anesthesia Other Findings   Reproductive/Obstetrics                            Anesthesia Physical Anesthesia Plan  ASA: 2  Anesthesia Plan: General   Post-op Pain Management: Tylenol PO (pre-op)   Induction: Intravenous  PONV Risk Score and Plan: 2 and Treatment may vary due to age or medical condition  Airway Management Planned: LMA  Additional Equipment: None  Intra-op Plan:   Post-operative Plan: Extubation in OR  Informed Consent: I have reviewed the patients History and Physical, chart, labs and discussed the procedure including the risks, benefits and alternatives for the proposed anesthesia with the patient or authorized representative who has indicated his/her understanding and acceptance.     Dental advisory given  Plan Discussed with: CRNA, Anesthesiologist and Surgeon  Anesthesia Plan Comments:        Anesthesia Quick Evaluation

## 2021-08-06 NOTE — Transfer of Care (Signed)
Immediate Anesthesia Transfer of Care Note  Patient: Timothy Clay  Procedure(s) Performed: REVISION RIGHT CARPAL TUNNEL RELEASE (Right: Wrist)  Patient Location: PACU  Anesthesia Type:General  Level of Consciousness: awake, alert  and oriented  Airway & Oxygen Therapy: Patient Spontanous Breathing and Patient connected to face mask oxygen  Post-op Assessment: Report given to RN and Post -op Vital signs reviewed and stable  Post vital signs: Reviewed and stable  Last Vitals:  Vitals Value Taken Time  BP    Temp    Pulse 64 08/06/21 1052  Resp 11 08/06/21 1052  SpO2 99 % 08/06/21 1052  Vitals shown include unvalidated device data.  Last Pain:  Vitals:   08/06/21 0819  TempSrc: Oral  PainSc: 10-Worst pain ever         Complications: No notable events documented.

## 2021-08-06 NOTE — Brief Op Note (Signed)
08/06/2021  10:45 AM  PATIENT:  Timothy Clay  85 y.o. male  PRE-OPERATIVE DIAGNOSIS:  Right Carpal Tunnel Syndrome  POST-OPERATIVE DIAGNOSIS:  Right Carpal Tunnel Syndrome  PROCEDURE:  Procedure(s): REVISION RIGHT CARPAL TUNNEL RELEASE (Right)  SURGEON:  Surgeon(s) and Role:    * Sherilyn Cooter, MD - Primary  PHYSICIAN ASSISTANT:   ASSISTANTS: none   ANESTHESIA:   general  EBL:  10 mL   BLOOD ADMINISTERED:none  DRAINS: none   LOCAL MEDICATIONS USED:  NONE  SPECIMEN:  No Specimen  DISPOSITION OF SPECIMEN:  N/A  COUNTS:  YES  TOURNIQUET:   Total Tourniquet Time Documented: Upper Arm (Right) - 31 minutes Total: Upper Arm (Right) - 31 minutes   DICTATION: .Viviann Spare Dictation  PLAN OF CARE: Discharge to home after PACU  PATIENT DISPOSITION:  PACU - hemodynamically stable.   Delay start of Pharmacological VTE agent (>24hrs) due to surgical blood loss or risk of bleeding: not applicable

## 2021-08-06 NOTE — Op Note (Addendum)
Date of Surgery: 08/06/2021  INDICATIONS: Timothy Clay is a 85 y.o.-year-old male with right carpal tunnel syndrome that has persisted following primary carpal tunnel surgery earlier this year.  After his initial surgery, he improved slightly but continued to have symptoms including numbness, paresthesias, and burning pain in hand.  MRI was obtained which suggested flattening of the median nerve in the carpal tunnel with proximal nerve swelling.  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  Risks, benefits, and alternatives to surgery were again discussed with the patient wishing to proceed with surgery.  Informed consent was signed after our discussion.   PREOPERATIVE DIAGNOSIS: 1. Carpal tunnel syndrome  POSTOPERATIVE DIAGNOSIS: Same.  PROCEDURE: 1. Revision right carpal tunnel release   SURGEON: Audria Nine, M.D.  ASSIST:   ANESTHESIA:  general  IV FLUIDS AND URINE: See anesthesia.  ESTIMATED BLOOD LOSS: 5 mL.  IMPLANTS: * No implants in log *   DRAINS: None  COMPLICATIONS: see description of procedure.  DESCRIPTION OF PROCEDURE: The patient was met in the preoperative holding area where the surgical site was marked and the consent form was verified.  The patient was then taken to the operating room and transferred to the operating table.  All bony prominences were well padded.  A tourniquet was applied to the right upper extremity.  The operative extremity was prepped and draped in the usual and sterile fashion.  A formal time-out was performed to confirm that this was the correct patient, surgery, side, and site.   Following formal timeout, the limb was exsanguinated with an Esmarch bandage and the tourniquet inflated to 50 mmHg.  An incision was designed in line with the radial border of the ring finger ray extending proximally to the wrist flexion crease.  A Bruner type incision was made in an ulnar direction over the wrist flexion  crease.  This portion of the incision incorporated his previous surgical scar.  The incision was then carried proximally into the distal forearm.  The skin and subcutaneous tissue was sharply divided.  Small crossing vessels were coagulated with bipolar electrocautery.  The median nerve was identified proximally in the wound adjacent to the palmaris brevis tendon.  The nerve was then traced in a distal direction.  There was extensive scar tissue at the area of the wrist flexion crease in the area of the previous incision.  Careful dissection was made using both a 15 blade scalpel and a tenotomy scissor to follow the median nerve through this scarred tissue bed.  At the proximal edge of the transverse carpal ligament, the nerve was found to be very compressed and flattened.  The thenar musculature that originated from the transverse carpal ligament was bluntly swept off of the ligament using a scalpel. The transverse carpal ligament was sharply incised using a combination of a 15 blade scalpel and a tenotomy scissor.    Care was taken to avoid injury to the nerve directly below this ligament.  There was extensive Scar tissue overlying the transverse carpal ligament from his previous surgery.  The ligament was released distally to the level of the fat surrounding the palmar arch.  At this point, there were no compressive elements or tight fascial or ligamentous bands around the nerve.  The nerve had plenty of space.  Satisfied with our release, the tourniquet was let down.  There was appropriate filling of the epineurial vessels.  Hemostasis was achieved using a combination of direct pressure and bipolar cautery.  Once  hemostasis was achieved, the wound was thoroughly irrigated with sterile normal saline.  The wound was then closed using 4-0 nylon sutures in a horizontal and simple interrupted fashion.  The wound was dressed with Xeroform and bulky folded kerlix.  This was followed by an Ace wrap.    The patient was  then reversed from anesthesia and extubated uneventfully.  He was transferred from the operating table to the postoperative bed in stable condition.  He is and taken to the PACU.  All counts were correct observed in the procedure.  22 Modifier:  This case was significantly more challenging than a standard carpal tunnel surgery given the revision nature of the procedure.  There was extensive scar tissue around the median nerve which required more extensive and delicate dissection than a standard carpal tunnel procedure.    POSTOPERATIVE PLAN: The patient will be discharged to home with appropriate pain medication and discharge instructions.  He will see me back in the office in 10-14 days for his first postop visit and suture removal.   Audria Nine, MD 10:48 AM

## 2021-08-07 NOTE — Addendum Note (Signed)
Addendum  created 08/07/21 1151 by Willa Frater, CRNA   Charge Capture section accepted

## 2021-08-08 ENCOUNTER — Encounter (HOSPITAL_BASED_OUTPATIENT_CLINIC_OR_DEPARTMENT_OTHER): Payer: Self-pay | Admitting: Orthopedic Surgery

## 2021-08-21 ENCOUNTER — Ambulatory Visit (INDEPENDENT_AMBULATORY_CARE_PROVIDER_SITE_OTHER): Payer: Medicare Other | Admitting: Orthopedic Surgery

## 2021-08-21 ENCOUNTER — Encounter: Payer: Self-pay | Admitting: Orthopedic Surgery

## 2021-08-21 ENCOUNTER — Other Ambulatory Visit: Payer: Self-pay

## 2021-08-21 VITALS — BP 131/88 | HR 53

## 2021-08-21 DIAGNOSIS — G5601 Carpal tunnel syndrome, right upper limb: Secondary | ICD-10-CM

## 2021-08-21 NOTE — Progress Notes (Signed)
° °  Post-Op Visit Note   Patient: Timothy Clay           Date of Birth: 1936/01/03           MRN: [id number] Visit Date: 08/21/2021 PCP: Andres Shad, MD   Assessment & Plan:  Chief Complaint:  Chief Complaint  Patient presents with   Right Hand - Post-op Follow-up   Visit Diagnoses:  1. Carpal tunnel syndrome, right upper limb     Plan: Patient is now two weeks out from revision carpal tunnel release.  His nocturnal symptoms have completely resolved and he is now able to sleep through the night.  His burning pain has resolved.  He still describes numbness in the thumb, index, and middle fingers.  Discussed that this will hopefully continue to improve as the nerve recovers.  He has some expected postop swelling and hand stiffness.  His incision is well healed without surrounding erythema or induration.  His sutures were removed. He would like to have home health hand therapy in [place] in possible.  I will see him back in a month to see how he's progressing.   Follow-Up Instructions: No follow-ups on file.   Orders:  No orders of the defined types were placed in this encounter.  No orders of the defined types were placed in this encounter.   Imaging: No results found.  PMFS History: Patient Active Problem List   Diagnosis Date Noted   Complex regional pain syndrome type 1 affecting hand, right 05/22/2021   Carpal tunnel syndrome, right upper limb 02/14/2021    Class: Chronic   Carpal tunnel syndrome, bilateral 01/13/2021    Class: Chronic   Gout 01/03/2021   Type 2 diabetes mellitus ([place]) 01/03/2021   Hypertension 01/03/2021   Past Medical History:  Diagnosis Date   Cancer [facility])    prostate cancer, not treating just watching numbers   Chronic kidney disease    CKD   Diabetes mellitus without complication ([place])    no meds now   Gout    Hypertension    Memory deficit     History reviewed. No pertinent family history.  Past Surgical History:   Procedure Laterality Date   BACK SURGERY     CARPAL TUNNEL RELEASE Left 01/13/2021   Procedure: LEFT OPEN CARPAL TUNNEL RELEASE;  Surgeon: Jessy Oto, MD;  Location: [place];  Service: Orthopedics;  Laterality: Left;   CARPAL TUNNEL RELEASE Right 02/14/2021   Procedure: RIGHT OPEN CARPAL TUNNEL RELEASE;  Surgeon: Jessy Oto, MD;  Location: [place];  Service: Orthopedics;  Laterality: Right;   CARPAL TUNNEL RELEASE Right 08/06/2021   Procedure: REVISION RIGHT CARPAL TUNNEL RELEASE;  Surgeon: Sherilyn Cooter, MD;  Location: [place];  Service: Orthopedics;  Laterality: Right;   COLONOSCOPY W/ BIOPSIES AND POLYPECTOMY     KNEE SURGERY     TONSILLECTOMY     Social History   Occupational History   Not on file  Tobacco Use   Smoking status: Never   Smokeless tobacco: Never  Vaping Use   Vaping Use: Never used  Substance and Sexual Activity   Alcohol use: Never   Drug use: Never   Sexual activity: Not on file

## 2021-09-18 ENCOUNTER — Telehealth: Payer: Self-pay

## 2021-09-18 NOTE — Telephone Encounter (Signed)
Patient called into the office stating that he has not had PT yet and I don't see where a referral has been sent in at this time. Does patient still need to come in for apt tomorrow ?   Please advise

## 2021-09-18 NOTE — Telephone Encounter (Signed)
Please advise 

## 2021-09-19 ENCOUNTER — Ambulatory Visit: Payer: Medicare Other | Admitting: Orthopedic Surgery

## 2021-09-26 ENCOUNTER — Ambulatory Visit (INDEPENDENT_AMBULATORY_CARE_PROVIDER_SITE_OTHER): Payer: Medicare Other | Admitting: Orthopedic Surgery

## 2021-09-26 ENCOUNTER — Other Ambulatory Visit: Payer: Self-pay

## 2021-09-26 ENCOUNTER — Encounter: Payer: Self-pay | Admitting: Orthopedic Surgery

## 2021-09-26 DIAGNOSIS — G5601 Carpal tunnel syndrome, right upper limb: Secondary | ICD-10-CM

## 2021-09-26 NOTE — Progress Notes (Signed)
° °  Post-Op Visit Note   Patient: Timothy Clay           Date of Birth: [birth year]           MRN: [id number] Visit Date: 09/26/2021 PCP: Andres Shad, MD   Assessment & Plan:  Chief Complaint:  Chief Complaint  Patient presents with   Right Wrist - Routine Post Op   Visit Diagnoses:  1. Carpal tunnel syndrome, right upper limb     Plan: Patient is approximately 7 weeks out from right revision carpal tunnel release.  His previous severe right hand pain has resolved as has his nocturnal symptoms.  He has continued numbness in the index and middle fingers.  He is able to make complete fist with strong grip strength which is an improvement from previous.  We discussed that his numbness may never completely resolve given the severe median nerve compression.  He has no Tinel at the wrist with a well healed incision.  I can see him back again in a few months if he's still having issues to see if his numbness is improving.   Follow-Up Instructions: No follow-ups on file.   Orders:  No orders of the defined types were placed in this encounter.  No orders of the defined types were placed in this encounter.   Imaging: No results found.  PMFS History: Patient Active Problem List   Diagnosis Date Noted   Complex regional pain syndrome type 1 affecting hand, right 05/22/2021   Carpal tunnel syndrome, right upper limb 02/14/2021    Class: Chronic   Carpal tunnel syndrome, bilateral 01/13/2021    Class: Chronic   Gout 01/03/2021   Type 2 diabetes mellitus ([place]) 01/03/2021   Hypertension 01/03/2021   Past Medical History:  Diagnosis Date   Cancer [facility])    prostate cancer, not treating just watching numbers   Chronic kidney disease    CKD   Diabetes mellitus without complication ([place])    no meds now   Gout    Hypertension    Memory deficit     History reviewed. No pertinent family history.  Past Surgical History:  Procedure Laterality Date   BACK SURGERY      CARPAL TUNNEL RELEASE Left 01/13/2021   Procedure: LEFT OPEN CARPAL TUNNEL RELEASE;  Surgeon: Jessy Oto, MD;  Location: [place];  Service: Orthopedics;  Laterality: Left;   CARPAL TUNNEL RELEASE Right 02/14/2021   Procedure: RIGHT OPEN CARPAL TUNNEL RELEASE;  Surgeon: Jessy Oto, MD;  Location: [place];  Service: Orthopedics;  Laterality: Right;   CARPAL TUNNEL RELEASE Right 08/06/2021   Procedure: REVISION RIGHT CARPAL TUNNEL RELEASE;  Surgeon: Sherilyn Cooter, MD;  Location: [place];  Service: Orthopedics;  Laterality: Right;   COLONOSCOPY W/ BIOPSIES AND POLYPECTOMY     KNEE SURGERY     TONSILLECTOMY     Social History   Occupational History   Not on file  Tobacco Use   Smoking status: Never   Smokeless tobacco: Never  Vaping Use   Vaping Use: Never used  Substance and Sexual Activity   Alcohol use: Never   Drug use: Never   Sexual activity: Not on file

## 2021-09-30 NOTE — Telephone Encounter (Signed)
Patient called again and is asking if PT is still needed ?  Please advise

## 2021-12-29 ENCOUNTER — Encounter: Payer: Self-pay | Admitting: Specialist

## 2022-02-02 ENCOUNTER — Telehealth: Payer: Self-pay | Admitting: Orthopedic Surgery

## 2022-02-02 NOTE — Telephone Encounter (Signed)
Patient has been having right hand pain and wanted to know if he need an appointment to have a cortisone injection. Please let April J. Know. Thanks

## 2022-02-10 ENCOUNTER — Ambulatory Visit (INDEPENDENT_AMBULATORY_CARE_PROVIDER_SITE_OTHER): Payer: Medicare Other | Admitting: Orthopedic Surgery

## 2022-02-10 DIAGNOSIS — G5601 Carpal tunnel syndrome, right upper limb: Secondary | ICD-10-CM | POA: Diagnosis not present

## 2022-02-10 DIAGNOSIS — M79644 Pain in right finger(s): Secondary | ICD-10-CM | POA: Insufficient documentation

## 2022-02-10 MED ORDER — LIDOCAINE HCL 1 % IJ SOLN
1.0000 mL | INTRAMUSCULAR | Status: AC | PRN
  Administered 2022-02-10: 1 mL

## 2022-02-10 MED ORDER — BETAMETHASONE SOD PHOS & ACET 6 (3-3) MG/ML IJ SUSP
6.0000 mg | INTRAMUSCULAR | Status: AC | PRN
  Administered 2022-02-10: 6 mg via INTRA_ARTICULAR

## 2022-02-10 NOTE — Progress Notes (Signed)
Office Visit Note   Patient: Timothy Clay           Date of Birth: 12-05-1935           MRN: [id number] Visit Date: 02/10/2022              Requested by: Andres Shad, MD [address] [place] [address] PCP: Andres Shad, MD   Assessment & Plan: Visit Diagnoses:  1. Carpal tunnel syndrome, right upper limb   2. Pain in right finger(s)     Plan: Patient developed not having numbness or paresthesias in the right hand that could be consistent with carpal tunnel syndrome.  Seems like his periods carpal tunnel symptoms have completely resolved.  His biggest issue now is pain in the index and middle fingers with limited flexion.  This been going for on for some time now.  He has mildly tender palpation over the A1 pulleys but without obvious catching, clicking, or locking.  We discussed that this may be related to a trigger finger.  We can try corticosteroid junction into the index and middle finger A1 pulleys.  I will see him back in 2 months or so if he still symptomatic.  Follow-Up Instructions: No follow-ups on file.   Orders:  No orders of the defined types were placed in this encounter.  No orders of the defined types were placed in this encounter.     Procedures: Hand/UE Inj: R index A1 for trigger finger on 02/10/2022 11:18 AM Indications: tendon swelling, therapeutic and diagnostic Details: 25 G needle, volar approach Medications: 1 mL lidocaine 1 %; 6 mg betamethasone acetate-betamethasone sodium phosphate 6 (3-3) MG/ML Procedure, treatment alternatives, risks and benefits explained, specific risks discussed. Consent was given by the patient. Immediately prior to procedure a time out was called to verify the correct patient, procedure, equipment, support staff and site/side marked as required. Patient was prepped and draped in the usual sterile fashion.    Hand/UE Inj: R long A1 for trigger finger on 02/10/2022 11:18 AM Indications: therapeutic,  tendon swelling and diagnostic Details: 25 G needle, volar approach Medications: 1 mL lidocaine 1 %; 6 mg betamethasone acetate-betamethasone sodium phosphate 6 (3-3) MG/ML Procedure, treatment alternatives, risks and benefits explained, specific risks discussed. Consent was given by the patient. Immediately prior to procedure a time out was called to verify the correct patient, procedure, equipment, support staff and site/side marked as required. Patient was prepped and draped in the usual sterile fashion.       Clinical Data: No additional findings.   Subjective: Chief Complaint  Patient presents with   Right Hand - Follow-up    This is an 86 year old right-hand-dominant male who is status post revision right carpal tunnel release on 08/06/2022.  He did well postsurgically and notes that the previous numbness, paresthesias, nocturnal symptoms have resolved.  His issue today is stiffness involving the index and middle finger.  He is has limited ability make a fist with these 2 fingers.  There is also associated pain in the fingers that he has difficulty localizing.  He denies any locking, catching, or clicking in these fingers.  He denies any trauma to his fingers.  He previously did hand therapy as part of his carpal tunnel surgery rehab and notes that at that point he did have better range of motion of these fingers.  He has no issues with his ring or small finger.    Review of Systems   Objective: Vital  Signs: There were no vitals taken for this visit.  Physical Exam  Right Hand Exam   Tenderness  Right hand tenderness location: TTP over A1 pulleys of the index and middle fingers.  No TTP at PIP or DIP joints.  Other  Erythema: absent Sensation: normal Pulse: present  Comments:  Lacks ~1 cm of flexion from the distal palmar crease with the index and middle fingers.  No palpable locking or catching of these fingers.  No swelling compared to the contralateral hand.   Previous revision CTR incision is well healed.       Specialty Comments:  No specialty comments available.  Imaging: No results found.   PMFS History: Patient Active Problem List   Diagnosis Date Noted   Pain in right finger(s) 02/10/2022   Complex regional pain syndrome type 1 affecting hand, right 05/22/2021   Carpal tunnel syndrome, right upper limb 02/14/2021    Class: Chronic   Carpal tunnel syndrome, bilateral 01/13/2021    Class: Chronic   Gout 01/03/2021   Type 2 diabetes mellitus ([place]) 01/03/2021   Hypertension 01/03/2021   Past Medical History:  Diagnosis Date   Cancer [facility])    prostate cancer, not treating just watching numbers   Chronic kidney disease    CKD   Diabetes mellitus without complication ([place])    no meds now   Gout    Hypertension    Memory deficit     No family history on file.  Past Surgical History:  Procedure Laterality Date   BACK SURGERY     CARPAL TUNNEL RELEASE Left 01/13/2021   Procedure: LEFT OPEN CARPAL TUNNEL RELEASE;  Surgeon: Jessy Oto, MD;  Location: [place];  Service: Orthopedics;  Laterality: Left;   CARPAL TUNNEL RELEASE Right 02/14/2021   Procedure: RIGHT OPEN CARPAL TUNNEL RELEASE;  Surgeon: Jessy Oto, MD;  Location: [place];  Service: Orthopedics;  Laterality: Right;   CARPAL TUNNEL RELEASE Right 08/06/2021   Procedure: REVISION RIGHT CARPAL TUNNEL RELEASE;  Surgeon: Sherilyn Cooter, MD;  Location: [place];  Service: Orthopedics;  Laterality: Right;   COLONOSCOPY W/ BIOPSIES AND POLYPECTOMY     KNEE SURGERY     TONSILLECTOMY     Social History   Occupational History   Not on file  Tobacco Use   Smoking status: Never   Smokeless tobacco: Never  Vaping Use   Vaping Use: Never used  Substance and Sexual Activity   Alcohol use: Never   Drug use: Never   Sexual activity: Not on file
